# Patient Record
Sex: Male | Born: 1940 | Race: Black or African American | Hispanic: No | Marital: Married | Smoking: Never smoker
Health system: Southern US, Community
[De-identification: ages and names within clinical notes are randomized; demographics above are authoritative.]

## PROBLEM LIST (undated history)

## (undated) DIAGNOSIS — I639 Cerebral infarction, unspecified: Secondary | ICD-10-CM

## (undated) DIAGNOSIS — I10 Essential (primary) hypertension: Secondary | ICD-10-CM

---

## 2015-12-09 ENCOUNTER — Encounter: Payer: Self-pay | Admitting: Emergency Medicine

## 2015-12-09 ENCOUNTER — Encounter: Payer: Medicaid Other | Attending: Surgery | Admitting: Surgery

## 2015-12-09 ENCOUNTER — Inpatient Hospital Stay
Admission: EM | Admit: 2015-12-09 | Discharge: 2015-12-19 | DRG: 988 | Disposition: A | Discharge status: Home Health | Payer: Medicaid Other | Attending: Internal Medicine | Admitting: Internal Medicine

## 2015-12-09 ENCOUNTER — Emergency Department: Payer: Medicaid Other

## 2015-12-09 ENCOUNTER — Other Ambulatory Visit: Payer: Self-pay

## 2015-12-09 DIAGNOSIS — F0390 Unspecified dementia without behavioral disturbance: Secondary | ICD-10-CM | POA: Diagnosis not present

## 2015-12-09 DIAGNOSIS — Z7902 Long term (current) use of antithrombotics/antiplatelets: Secondary | ICD-10-CM

## 2015-12-09 DIAGNOSIS — L089 Local infection of the skin and subcutaneous tissue, unspecified: Secondary | ICD-10-CM

## 2015-12-09 DIAGNOSIS — Z992 Dependence on renal dialysis: Secondary | ICD-10-CM | POA: Insufficient documentation

## 2015-12-09 DIAGNOSIS — D62 Acute posthemorrhagic anemia: Secondary | ICD-10-CM | POA: Diagnosis not present

## 2015-12-09 DIAGNOSIS — L89620 Pressure ulcer of left heel, unstageable: Secondary | ICD-10-CM | POA: Diagnosis present

## 2015-12-09 DIAGNOSIS — I739 Peripheral vascular disease, unspecified: Secondary | ICD-10-CM | POA: Diagnosis present

## 2015-12-09 DIAGNOSIS — L89610 Pressure ulcer of right heel, unstageable: Secondary | ICD-10-CM | POA: Diagnosis present

## 2015-12-09 DIAGNOSIS — N390 Urinary tract infection, site not specified: Secondary | ICD-10-CM | POA: Diagnosis present

## 2015-12-09 DIAGNOSIS — I69320 Aphasia following cerebral infarction: Secondary | ICD-10-CM | POA: Diagnosis not present

## 2015-12-09 DIAGNOSIS — I69354 Hemiplegia and hemiparesis following cerebral infarction affecting left non-dominant side: Secondary | ICD-10-CM | POA: Diagnosis not present

## 2015-12-09 DIAGNOSIS — I69398 Other sequelae of cerebral infarction: Secondary | ICD-10-CM

## 2015-12-09 DIAGNOSIS — M1990 Unspecified osteoarthritis, unspecified site: Secondary | ICD-10-CM | POA: Insufficient documentation

## 2015-12-09 DIAGNOSIS — T148XXA Other injury of unspecified body region, initial encounter: Secondary | ICD-10-CM

## 2015-12-09 DIAGNOSIS — R000 Tachycardia, unspecified: Secondary | ICD-10-CM | POA: Diagnosis not present

## 2015-12-09 DIAGNOSIS — L89614 Pressure ulcer of right heel, stage 4: Secondary | ICD-10-CM | POA: Diagnosis present

## 2015-12-09 DIAGNOSIS — Z79899 Other long term (current) drug therapy: Secondary | ICD-10-CM

## 2015-12-09 DIAGNOSIS — L97409 Non-pressure chronic ulcer of unspecified heel and midfoot with unspecified severity: Secondary | ICD-10-CM | POA: Diagnosis present

## 2015-12-09 DIAGNOSIS — D649 Anemia, unspecified: Secondary | ICD-10-CM | POA: Diagnosis present

## 2015-12-09 DIAGNOSIS — B964 Proteus (mirabilis) (morganii) as the cause of diseases classified elsewhere: Secondary | ICD-10-CM | POA: Diagnosis present

## 2015-12-09 DIAGNOSIS — F0150 Vascular dementia without behavioral disturbance: Secondary | ICD-10-CM | POA: Diagnosis present

## 2015-12-09 DIAGNOSIS — N400 Enlarged prostate without lower urinary tract symptoms: Secondary | ICD-10-CM | POA: Insufficient documentation

## 2015-12-09 DIAGNOSIS — M868X7 Other osteomyelitis, ankle and foot: Secondary | ICD-10-CM | POA: Diagnosis present

## 2015-12-09 DIAGNOSIS — I10 Essential (primary) hypertension: Secondary | ICD-10-CM | POA: Diagnosis present

## 2015-12-09 DIAGNOSIS — G463 Brain stem stroke syndrome: Secondary | ICD-10-CM | POA: Diagnosis not present

## 2015-12-09 DIAGNOSIS — E876 Hypokalemia: Secondary | ICD-10-CM | POA: Diagnosis present

## 2015-12-09 DIAGNOSIS — L8990 Pressure ulcer of unspecified site, unspecified stage: Secondary | ICD-10-CM | POA: Insufficient documentation

## 2015-12-09 DIAGNOSIS — L89624 Pressure ulcer of left heel, stage 4: Secondary | ICD-10-CM | POA: Insufficient documentation

## 2015-12-09 DIAGNOSIS — M869 Osteomyelitis, unspecified: Secondary | ICD-10-CM

## 2015-12-09 DIAGNOSIS — Z7401 Bed confinement status: Secondary | ICD-10-CM | POA: Diagnosis not present

## 2015-12-09 HISTORY — DX: Cerebral infarction, unspecified: I63.9

## 2015-12-09 HISTORY — DX: Essential (primary) hypertension: I10

## 2015-12-09 LAB — CBC
HCT: 42.9 % (ref 40.0–52.0)
Hemoglobin: 13.9 g/dL (ref 13.0–18.0)
MCH: 28.7 pg (ref 26.0–34.0)
MCHC: 32.5 g/dL (ref 32.0–36.0)
MCV: 88.4 fL (ref 80.0–100.0)
PLATELETS: 545 10*3/uL — AB (ref 150–440)
RBC: 4.85 MIL/uL (ref 4.40–5.90)
RDW: 13.6 % (ref 11.5–14.5)
WBC: 14 10*3/uL — ABNORMAL HIGH (ref 3.8–10.6)

## 2015-12-09 LAB — URINALYSIS COMPLETE WITH MICROSCOPIC (ARMC ONLY)
BILIRUBIN URINE: NEGATIVE
Bacteria, UA: NONE SEEN
GLUCOSE, UA: NEGATIVE mg/dL
HGB URINE DIPSTICK: NEGATIVE
Ketones, ur: NEGATIVE mg/dL
Nitrite: NEGATIVE
PH: 5 (ref 5.0–8.0)
Protein, ur: NEGATIVE mg/dL
SPECIFIC GRAVITY, URINE: 1.018 (ref 1.005–1.030)
SQUAMOUS EPITHELIAL / LPF: NONE SEEN

## 2015-12-09 LAB — BASIC METABOLIC PANEL
Anion gap: 12 (ref 5–15)
BUN: 21 mg/dL — AB (ref 6–20)
CALCIUM: 9.9 mg/dL (ref 8.9–10.3)
CO2: 22 mmol/L (ref 22–32)
CREATININE: 1.06 mg/dL (ref 0.61–1.24)
Chloride: 100 mmol/L — ABNORMAL LOW (ref 101–111)
GFR calc Af Amer: >60 mL/min (ref >60)
GFR calc non Af Amer: >60 mL/min (ref >60)
GLUCOSE: 125 mg/dL — AB (ref 65–99)
Potassium: 3.9 mmol/L (ref 3.5–5.1)
Sodium: 134 mmol/L — ABNORMAL LOW (ref 135–145)

## 2015-12-09 MED ORDER — HYDROCODONE-ACETAMINOPHEN 5-325 MG PO TABS
1.0000 | ORAL_TABLET | ORAL | Status: DC | PRN
  Administered 2015-12-13: 1 via ORAL
  Administered 2015-12-16 – 2015-12-18 (×3): 2 via ORAL
  Filled 2015-12-09: qty 1
  Filled 2015-12-09 (×3): qty 2

## 2015-12-09 MED ORDER — NIFEDIPINE 10 MG PO CAPS
20.0000 mg | ORAL_CAPSULE | Freq: Every day | ORAL | Status: DC
  Administered 2015-12-10 – 2015-12-11 (×2): 20 mg via ORAL
  Filled 2015-12-09 (×3): qty 2

## 2015-12-09 MED ORDER — SENNOSIDES-DOCUSATE SODIUM 8.6-50 MG PO TABS
1.0000 | ORAL_TABLET | Freq: Every evening | ORAL | Status: DC | PRN
  Filled 2015-12-09: qty 1

## 2015-12-09 MED ORDER — ONDANSETRON HCL 4 MG PO TABS: 4.0000 mg | ORAL_TABLET | Freq: Four times a day (QID) | ORAL | Status: DC | PRN

## 2015-12-09 MED ORDER — ONDANSETRON HCL 4 MG/2ML IJ SOLN: 4.0000 mg | Freq: Four times a day (QID) | INTRAMUSCULAR | Status: DC | PRN

## 2015-12-09 MED ORDER — VANCOMYCIN HCL IN DEXTROSE 750-5 MG/150ML-% IV SOLN
750.0000 mg | INTRAVENOUS | Status: DC
  Administered 2015-12-10: 750 mg via INTRAVENOUS
  Filled 2015-12-09 (×2): qty 150

## 2015-12-09 MED ORDER — CLOPIDOGREL BISULFATE 75 MG PO TABS
75.0000 mg | ORAL_TABLET | Freq: Every day | ORAL | Status: DC
  Administered 2015-12-10 – 2015-12-16 (×4): 75 mg via ORAL
  Filled 2015-12-09 (×5): qty 1

## 2015-12-09 MED ORDER — ACETAMINOPHEN 325 MG PO TABS
650.0000 mg | ORAL_TABLET | Freq: Four times a day (QID) | ORAL | Status: DC | PRN
  Administered 2015-12-14 (×2): 650 mg via ORAL
  Filled 2015-12-09 (×2): qty 2

## 2015-12-09 MED ORDER — ENOXAPARIN SODIUM 40 MG/0.4ML ~~LOC~~ SOLN
40.0000 mg | SUBCUTANEOUS | Status: DC
  Administered 2015-12-09 – 2015-12-15 (×6): 40 mg via SUBCUTANEOUS
  Filled 2015-12-09 (×7): qty 0.4

## 2015-12-09 MED ORDER — PIPERACILLIN-TAZOBACTAM 3.375 G IVPB 30 MIN
3.3750 g | Freq: Once | INTRAVENOUS | Status: AC
  Administered 2015-12-09: 3.375 g via INTRAVENOUS
  Filled 2015-12-09: qty 50

## 2015-12-09 MED ORDER — PIPERACILLIN-TAZOBACTAM 3.375 G IVPB
3.3750 g | Freq: Three times a day (TID) | INTRAVENOUS | Status: DC
  Administered 2015-12-10 (×2): 3.375 g via INTRAVENOUS
  Filled 2015-12-09 (×3): qty 50

## 2015-12-09 MED ORDER — VANCOMYCIN HCL IN DEXTROSE 750-5 MG/150ML-% IV SOLN: 750.0000 mg | INTRAVENOUS | Status: DC

## 2015-12-09 MED ORDER — ACETAMINOPHEN 650 MG RE SUPP: 650.0000 mg | Freq: Four times a day (QID) | RECTAL | Status: DC | PRN

## 2015-12-09 MED ORDER — ALUM & MAG HYDROXIDE-SIMETH 200-200-20 MG/5ML PO SUSP: 30.0000 mL | Freq: Four times a day (QID) | ORAL | Status: DC | PRN

## 2015-12-09 MED ORDER — PIPERACILLIN-TAZOBACTAM 3.375 G IVPB: 3.3750 g | Freq: Three times a day (TID) | INTRAVENOUS | Status: DC

## 2015-12-09 MED ORDER — VANCOMYCIN HCL IN DEXTROSE 1-5 GM/200ML-% IV SOLN
1000.0000 mg | Freq: Once | INTRAVENOUS | Status: AC
  Administered 2015-12-09: 1000 mg via INTRAVENOUS
  Filled 2015-12-09: qty 200

## 2015-12-09 MED ORDER — SODIUM CHLORIDE 0.9 % IV SOLN
INTRAVENOUS | Status: DC
  Administered 2015-12-09 – 2015-12-17 (×10): via INTRAVENOUS

## 2015-12-09 NOTE — ED Notes (Addendum)
Pt to ed with c/o bilat feet/heel ulcers.  Pt was seen at wound care clinic today and sent to er for eval. Foul odor noted from feet. Pt is nonverbal since CVA.  Family with pt at this time,  Pt appears weak, unable to sit up in wheelchair.

## 2015-12-09 NOTE — Progress Notes (Signed)
ANTIBIOTIC CONSULT NOTE - INITIAL  Pharmacy Consult for piperacillin/tazobactam and vancomycin Indication: Wound infection  No Known Allergies  Patient Measurements: Height: 5' (152.4 cm) Weight: 160 lb (72.576 kg) IBW/kg (Calculated) : 50 Adjusted Body Weight: 59 kg  Vital Signs: Temp: 97.5 F (36.4 C) (01/31 1027) Temp Source: Oral (01/31 1027) BP: 142/76 mmHg (01/31 1027) Pulse Rate: 108 (01/31 1027) Intake/Output from previous day:   Intake/Output from this shift:    Labs:  Recent Labs  12/09/15 1146  WBC 14.0*  HGB 13.9  PLT 545*  CREATININE 1.06   Estimated Creatinine Clearance: 51 mL/min (by C-G formula based on Cr of 1.06). No results for input(s): VANCOTROUGH, VANCOPEAK, VANCORANDOM, GENTTROUGH, GENTPEAK, GENTRANDOM, TOBRATROUGH, TOBRAPEAK, TOBRARND, AMIKACINPEAK, AMIKACINTROU, AMIKACIN in the last 72 hours.   Microbiology: No results found for this or any previous visit (from the past 720 hour(s)).  Medical History: Past Medical History  Diagnosis Date  . CVA (cerebral infarction)   . Hypertension    Assessment: Pharmacy consulted to dose vancomycin and piperacillin/tazobactam for a wound infection in this 75 year old male.   Adjusted body weight: 59 kg CrCl = 51 mL/min  Kinetics: Ke: 0.047 Half-life: 14.7 hours Vd: 41 L  Cmin (estimated) 15.2 mcg/mL  Goal of Therapy:  Vancomycin trough level 15-20 mcg/ml  Plan:  Measure antibiotic drug levels at steady state Follow up culture results  Piperacillin/tazobactam 3.375 g IV q 8 hours EI Vancomycin 1000 mg x 1 dose in the ED followed by vancomycin 750 mg IV q 18 hours. Vancomycin trough at 2/3 @ 0830, which is prior to the 5th dose and should represent steady state.    Cindi Carbon, PharmD Clinical Pharmacist 12/09/2015,7:00 PM

## 2015-12-09 NOTE — H&P (Signed)
[facility] [place] at [facility]   PATIENT NAME: Jeremiah Scott    MR#:  [id number]  DATE OF BIRTH:  January 08, 1941  DATE OF ADMISSION:  12/09/2015  PRIMARY CARE PHYSICIAN: No PCP Per Patient   REQUESTING/REFERRING PHYSICIAN: Dr Langston Masker  CHIEF COMPLAINT:   Heel ulcers HISTORY OF PRESENT ILLNESS:  Jeremiah Scott  is a 76 y.o. male with a known history of  Dementia, essential hypertension in a patient secondary to previous CVA who presents from wound care with bilateral heel ulcers.He has foul odor from his feet.  At baseline patient is a phasic.  PAST MEDICAL HISTORY:   Past Medical History  Diagnosis Date  . CVA (cerebral infarction)   . Hypertension     PAST SURGICAL HISTORY:    postate surgery SOCIAL HISTORY:   Social History  Substance Use Topics  . Smoking status: Never Smoker   . Smokeless tobacco: no  . Alcohol Use: No    FAMILY HISTORY:   unobtainble this patient is a phasic and there is no family at bedside  DRUG ALLERGIES:  No Known Allergies   REVIEW OF SYSTEMS:   patient is aphasic from previous stroke  MEDICATIONS AT HOME:   nifeipine 20 daily  Plavix 75 mg daily  VITAL SIGNS:  Blood pressure 142/76, pulse 108, temperature 97.5 F (36.4 C), temperature source Oral, resp. rate 20, height 5' (1.524 m), weight 72.576 kg (160 lb), SpO2 97 %.  PHYSICAL EXAMINATION:  GENERAL:  75 y.o.-year-old patient lying in the bed with no acute distress.  EYES: Pupils equal, round, reactive to light and accommodation. No scleral icterus.   HEENT: Head atraumatic, normocephalic. Oropharynx and nasopharynx clear.  NECK:  Supple, no jugular venous distention. No thyroid enlargement, no tenderness.  LUNGS: Normal breath sounds bilaterally, no wheezing, rales,rhonchi or crepitation. No use of accessory muscles of respiration.  CARDIOVASCULAR: S1, S2 normal. No murmurs, rubs, or gallops.  ABDOMEN: Soft, nontender, nondistended. Bowel sounds  present. No organomegaly or mass.  EXTREMITIES: No pedal edema, cyanosis, or clubbing.  NEUROLOGIC: aphasic moves extremtites PSYCHIATRIC: The patient is alert  SKIN: bilateral eschar heels with foul smell  LABORATORY PANEL:   CBC  Recent Labs Lab 12/09/15 1146  WBC 14.0*  HGB 13.9  HCT 42.9  PLT 545*   ------------------------------------------------------------------------------------------------------------------  Chemistries   Recent Labs Lab 12/09/15 1146  NA 134*  K 3.9  CL 100*  CO2 22  GLUCOSE 125*  BUN 21*  CREATININE 1.06  CALCIUM 9.9   ------------------------------------------------------------------------------------------------------------------  Cardiac Enzymes No results for input(s): TROPONINI in the last 168 hours. ------------------------------------------------------------------------------------------------------------------  RADIOLOGY:  No results found.  EKG:   Sinus tachycardia no ST elevation or depression  IMPRESSION AND PLAN:     75 year old male with history of stoke with residual aphasia who presents with bilateral heel ulcers from wound care clinic.  1. Bilateral  Heel ulcers: consult wound care team and PODIATRY. Wound culture. Start Zosyn and vancomycin.  I have ordered MRI to evaluate for  Osteomyelitis.  2.  Essential hypertension:continue nifedipine  3. Hx of CVA: Speech consult for diet. continue Plavix   All the records are reviewed and case discussed with ED provider. CODE STATUS: FULL  TOTAL TIME TAKING CARE OF THIS PATIENT: 50 minutes.    Roseana Rhine M.D on 12/09/2015 at 6:28 PM  Between 7am to 6pm - Pager - [phone] After 6pm go to [email or web address] - Scientist, research (life sciences) Hospitalists  Office  [phone]  CC: Primary care physician; No PCP Per Patient

## 2015-12-09 NOTE — ED Provider Notes (Signed)
[facility] Emergency Department Provider Note  ____________________________________________  Time seen: Approximately 545 PM  I have reviewed the triage vital signs and the nursing notes.   HISTORY  Chief Complaint Wound Infection    HPI Jeremiah Scott is a 75 y.o. male with a history of a CVA and hypertension who is presenting today from the wound clinic for bilateral heel ulcers. The patient is nonverbal with left-sided weakness which is residual from a stroke at baseline. He is here with his family member that says that he started out having difficulty with the skin on his feet this past November when he had calluses and then blistering. He has been using mupirocin but the wounds have been worsening. He was first be seen at [facility] wound care but they were unable to see him soon so he was brought to [facility] wound care. Earlier today he was evaluated there and then told to immediately come to the emergency department. No fevers at home.   Past Medical History  Diagnosis Date  . CVA (cerebral infarction)   . Hypertension     There are no active problems to display for this patient.   History reviewed. No pertinent past surgical history.  No current outpatient prescriptions on file.  Allergies Review of patient's allergies indicates no known allergies.  History reviewed. No pertinent family history.  Social History Social History  Substance Use Topics  . Smoking status: Never Smoker   . Smokeless tobacco: None  . Alcohol Use: No    Review of Systems  Caveat secondary to patient nonverbal. ____________________________________________   PHYSICAL EXAM:  VITAL SIGNS: ED Triage Vitals  Enc Vitals Group     BP 12/09/15 1027 142/76 mmHg     Pulse Rate 12/09/15 1027 108     Resp 12/09/15 1027 20     Temp 12/09/15 1027 97.5 F (36.4 C)     Temp Source 12/09/15 1027 Oral     SpO2 12/09/15 1027 97 %     Weight 12/09/15 1027 160 lb (72.576 kg)     Height 12/09/15 1027 5' (1.524 m)     Head Cir --      Peak Flow --      Pain Score --      Pain Loc --      Pain Edu? --      Excl. in GC? --     Constitutional: in no acute distress. Eyes: Conjunctivae are normal. PERRL. EOMI. Head: Atraumatic. Nose: No congestion/rhinnorhea. Mouth/Throat: Mucous membranes are moist.  Neck: No stridor.   Cardiovascular: Tachycardic, regular rhythm. Grossly normal heart sounds.  Faintly palpable bilateral dorsalis pedis pulses. Respiratory: Normal respiratory effort.  No retractions. Lungs CTAB. Gastrointestinal: Soft and nontender. No distention. Musculoskeletal: Left foot is edematous. Neurologic:  Nonverbal. Moving all 4 extremities. Skin:  Bilateral 4 x 6 cm heel ulcerations with necrotic eschar covering them and yellow pus surrounded by erythematous skin at the borders. The left foot is edematous with erythema streaking down the left lateral aspect. Both feet are tender to palpation with warmth. Ranges the toes to both feet.   Psychiatric: Mood and affect are normal. Speech and behavior are normal.  ____________________________________________   LABS (all labs ordered are listed, but only abnormal results are displayed)  Labs Reviewed  BASIC METABOLIC PANEL - Abnormal; Notable for the following:    Sodium 134 (*)    Chloride 100 (*)    Glucose, Bld 125 (*)    BUN 21 (*)  All other components within normal limits  CBC - Abnormal; Notable for the following:    WBC 14.0 (*)    Platelets 545 (*)    All other components within normal limits  CULTURE, BLOOD (ROUTINE X 2)  CULTURE, BLOOD (ROUTINE X 2)  URINALYSIS COMPLETEWITH MICROSCOPIC ([facility] ONLY)   ____________________________________________  EKG  ED ECG REPORT I, Arelia Longest, the attending physician, personally viewed and interpreted this ECG.   Date: 12/09/2015  EKG Time: 1139  Rate: 107  Rhythm: sinus tachycardia  Axis: Normal  Intervals:none  ST&T Change: No  ST segment elevation or depression. No abnormal T-wave inversions.  ____________________________________________  RADIOLOGY  Pending bilateral foot images. ____________________________________________   PROCEDURES  ____________________________________________   INITIAL IMPRESSION / ASSESSMENT AND PLAN / ED COURSE  Pertinent labs & imaging results that were available during my care of the patient were reviewed by me and considered in my medical decision making (see chart for details).  ----------------------------------------- 6:19 PM on 12/09/2015 -----------------------------------------  Discussed case with the family member who knows the patient needs to be admitted for IV antibiotics and surgical consultation. She understands the plan and is willing to comply. Signed out to Dr. Tildon Husky. Dr. Tildon Husky to follow-up with imaging. ____________________________________________   FINAL CLINICAL IMPRESSION(S) / ED DIAGNOSES  Bilateral necrotic heel ulcers.    Myrna Blazer, MD 12/09/15 Zollie Pee

## 2015-12-10 ENCOUNTER — Inpatient Hospital Stay: Payer: Medicaid Other

## 2015-12-10 DIAGNOSIS — L8990 Pressure ulcer of unspecified site, unspecified stage: Secondary | ICD-10-CM | POA: Insufficient documentation

## 2015-12-10 LAB — CBC
HEMATOCRIT: 35.6 % — AB (ref 40.0–52.0)
HEMOGLOBIN: 11.7 g/dL — AB (ref 13.0–18.0)
MCH: 28.9 pg (ref 26.0–34.0)
MCHC: 33 g/dL (ref 32.0–36.0)
MCV: 87.6 fL (ref 80.0–100.0)
Platelets: 515 10*3/uL — ABNORMAL HIGH (ref 150–440)
RBC: 4.07 MIL/uL — ABNORMAL LOW (ref 4.40–5.90)
RDW: 13.3 % (ref 11.5–14.5)
WBC: 11.3 10*3/uL — AB (ref 3.8–10.6)

## 2015-12-10 LAB — BASIC METABOLIC PANEL
ANION GAP: 10 (ref 5–15)
BUN: 20 mg/dL (ref 6–20)
CHLORIDE: 102 mmol/L (ref 101–111)
CO2: 25 mmol/L (ref 22–32)
Calcium: 9.6 mg/dL (ref 8.9–10.3)
Creatinine, Ser: 1.37 mg/dL — ABNORMAL HIGH (ref 0.61–1.24)
GFR calc Af Amer: 57 mL/min — ABNORMAL LOW (ref >60)
GFR, EST NON AFRICAN AMERICAN: 49 mL/min — AB (ref >60)
GLUCOSE: 165 mg/dL — AB (ref 65–99)
POTASSIUM: 3.6 mmol/L (ref 3.5–5.1)
Sodium: 137 mmol/L (ref 135–145)

## 2015-12-10 LAB — VANCOMYCIN, RANDOM: Vancomycin Rm: 17 ug/mL

## 2015-12-10 MED ORDER — LORAZEPAM 2 MG/ML IJ SOLN
1.0000 mg | Freq: Once | INTRAMUSCULAR | Status: AC
  Administered 2015-12-10: 1 mg via INTRAVENOUS
  Filled 2015-12-10: qty 1

## 2015-12-10 MED ORDER — VANCOMYCIN HCL IN DEXTROSE 750-5 MG/150ML-% IV SOLN
750.0000 mg | INTRAVENOUS | Status: DC
  Administered 2015-12-10 – 2015-12-13 (×5): 750 mg via INTRAVENOUS
  Filled 2015-12-10 (×6): qty 150

## 2015-12-10 MED ORDER — PIPERACILLIN-TAZOBACTAM 3.375 G IVPB
3.3750 g | Freq: Two times a day (BID) | INTRAVENOUS | Status: DC
  Administered 2015-12-11: 3.375 g via INTRAVENOUS
  Filled 2015-12-10 (×3): qty 50

## 2015-12-10 NOTE — Progress Notes (Signed)
MELVILLE, ENGEN ([id number]) Visit Report for 12/09/2015 Abuse/Suicide Risk Screen Details Patient Name: Jeremiah Scott, Jeremiah Scott Date of Service: 12/09/2015 8:00 AM Medical Record Number: [id number] Patient Account Number: [id number] Date of Birth/Sex: 1941/08/14 (75 y.o. Male) Treating RN: Curtis Sites Primary Care Physician: Other Clinician: Referring Physician: Treating Physician/Extender: Rudene Re in Treatment: 0 Abuse/Suicide Risk Screen Items Answer ABUSE/SUICIDE RISK SCREEN: Has anyone close to you tried to hurt or harm you recentlyo No Do you feel uncomfortable with anyone in your familyo No Has anyone forced you do things that you didnot want to doo No Do you have any thoughts of harming yourselfo No Patient displays signs or symptoms of abuse and/or neglect. No Electronic Signature(s) Signed: 12/09/2015 4:04:05 PM By: Curtis Sites Entered By: Curtis Sites on 12/09/2015 08:40:32 Jeremiah Scott ([id number]) -------------------------------------------------------------------------------- Activities of Daily Living Details Patient Name: Jeremiah Scott Date of Service: 12/09/2015 8:00 AM Medical Record Number: [id number] Patient Account Number: [id number] Date of Birth/Sex: 09-Nov-1940 (75 y.o. Male) Treating RN: Curtis Sites Primary Care Physician: Other Clinician: Referring Physician: Treating Physician/Extender: Rudene Re in Treatment: 0 Activities of Daily Living Items Answer Activities of Daily Living (Please select one for each item) Drive Automobile Not Able Take Medications Need Assistance Use Telephone Need Assistance Care for Appearance Need Assistance Use Toilet Need Assistance Bath / Shower Need Assistance Dress Self Need Assistance Feed Self Need Assistance Walk Need Assistance Get In / Out Bed Not Able Housework Not Able Prepare Meals Not Able Handle Money Not Able Shop for Self Not Able Electronic Signature(s) Signed: 12/09/2015 4:04:05 PM  By: Curtis Sites Entered By: Curtis Sites on 12/09/2015 08:42:44 Jeremiah Scott ([id number]) -------------------------------------------------------------------------------- Education Assessment Details Patient Name: Jeremiah Scott Date of Service: 12/09/2015 8:00 AM Medical Record Number: [id number] Patient Account Number: [id number] Date of Birth/Sex: June 05, 1941 (75 y.o. Male) Treating RN: Curtis Sites Primary Care Physician: Other Clinician: Referring Physician: Treating Physician/Extender: Rudene Re in Treatment: 0 Primary Learner Assessed: Caregiver Reason Patient is not Primary Learner: dementia Learning Preferences/Education Level/Primary Language Learning Preference: Explanation, Demonstration Highest Education Level: High School Preferred Language: English Cognitive Barrier Assessment/Beliefs Language Barrier: No Translator Needed: No Memory Deficit: No Emotional Barrier: No Cultural/Religious Beliefs Affecting Medical No Care: Physical Barrier Assessment Impaired Vision: No Impaired Hearing: No Decreased Hand dexterity: No Knowledge/Comprehension Assessment Knowledge Level: Medium Comprehension Level: Medium Ability to understand written Medium instructions: Ability to understand verbal Medium instructions: Motivation Assessment Anxiety Level: Calm Cooperation: Cooperative Education Importance: Acknowledges Need Interest in Health Problems: Asks Questions Perception: Coherent Willingness to Engage in Self- Medium Management Activities: Readiness to Engage in Self- Medium Management Activities: DESTYN, SCHUYLER ([id number]) Electronic Signature(s) Signed: 12/09/2015 4:04:05 PM By: Curtis Sites Entered By: Curtis Sites on 12/09/2015 08:43:28 Jeremiah Scott ([id number]) -------------------------------------------------------------------------------- Fall Risk Assessment Details Patient Name: Jeremiah Scott Date of Service: 12/09/2015 8:00  AM Medical Record Number: [id number] Patient Account Number: [id number] Date of Birth/Sex: 09-26-1941 (75 y.o. Male) Treating RN: Curtis Sites Primary Care Physician: Other Clinician: Referring Physician: Treating Physician/Extender: Rudene Re in Treatment: 0 Fall Risk Assessment Items Have you had 2 or more falls in the last 12 monthso 0 No Have you had any fall that resulted in injury in the last 12 monthso 0 No FALL RISK ASSESSMENT: History of falling - immediate or within 3 months 0 No Secondary diagnosis 0 No Ambulatory aid None/bed rest/wheelchair/nurse 0 Yes Crutches/cane/walker 0 No Furniture 0 No IV Access/Saline Lock 0 No Gait/Training Normal/bed rest/immobile 0 Yes Weak 10 Yes Impaired 0 No Mental  Status Oriented to own ability 0 Yes Electronic Signature(s) Signed: 12/09/2015 4:04:05 PM By: Curtis Sites Entered By: Curtis Sites on 12/09/2015 08:43:46 Jeremiah Scott ([id number]) -------------------------------------------------------------------------------- Foot Assessment Details Patient Name: Jeremiah Scott Date of Service: 12/09/2015 8:00 AM Medical Record Number: [id number] Patient Account Number: [id number] Date of Birth/Sex: 19-Sep-1941 (75 y.o. Male) Treating RN: Curtis Sites Primary Care Physician: Other Clinician: Referring Physician: Treating Physician/Extender: Rudene Re in Treatment: 0 Foot Assessment Items Site Locations + = Sensation present, - = Sensation absent, C = Callus, U = Ulcer R = Redness, W = Warmth, M = Maceration, PU = Pre-ulcerative lesion F = Fissure, S = Swelling, D = Dryness Assessment Right: Left: Other Deformity: No No Prior Foot Ulcer: No No Prior Amputation: No No Charcot Joint: No No Ambulatory Status: Non-ambulatory Assistance Device: Wheelchair GaitFutures trader) Signed: 12/09/2015 4:04:05 PM By: Curtis Sites Entered By: Curtis Sites on 12/09/2015 08:47:07 Jeremiah Scott  ([id number]) -------------------------------------------------------------------------------- Nutrition Risk Assessment Details Patient Name: Jeremiah Scott Date of Service: 12/09/2015 8:00 AM Medical Record Number: [id number] Patient Account Number: [id number] Date of Birth/Sex: 06-10-1941 (74 y.o. Male) Treating RN: Curtis Sites Primary Care Physician: Other Clinician: Referring Physician: Treating Physician/Extender: Rudene Re in Treatment: 0 Height (in): Weight (lbs): Body Mass Index (BMI): Nutrition Risk Assessment Items NUTRITION RISK SCREEN: I have an illness or condition that made me change the kind and/or 0 No amount of food I eat I eat fewer than two meals per day 3 Yes I eat few fruits and vegetables, or milk products 0 No I have three or more drinks of beer, liquor or wine almost every day 0 No I have tooth or mouth problems that make it hard for me to eat 0 No I don't always have enough money to buy the food I need 0 No I eat alone most of the time 0 No I take three or more different prescribed or over-the-counter drugs a 1 Yes day Without wanting to, I have lost or gained 10 pounds in the last six 0 No months I am not always physically able to shop, cook and/or feed myself 0 No Nutrition Protocols Good Risk Protocol Provide education on Moderate Risk Protocol 0 nutrition Electronic Signature(s) Signed: 12/09/2015 4:04:05 PM By: Curtis Sites Entered By: Curtis Sites on 12/09/2015 08:44:05

## 2015-12-10 NOTE — Consult Note (Signed)
[facility] Vascular Consult Note  MRN : [id number]  Jeremiah Scott is a 75 y.o. (1941/03/06) male who presents with chief complaint of  Chief Complaint  Patient presents with  . Wound Infection  .  History of Present Illness: I am asked by the hospitalist service to see the patient to assess his vascular status. Patient with bilateral heel ulcerations is admitted to the hospital. 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 He has bilateral heel ulcerations which his wife reports of been there for a couple of months. Apparently, 3 months ago he was walking which is a little difficult for me to believe. This has been a rapid deterioration and now he has large heel ulcerations bilaterally. He has a history of hypertension and stroke. His wife does not think he has had any vascular evaluations of his lower extremity. 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 He has had low-grade fevers and is admitted to the hospital for infections of his heel ulcerations.  Current Facility-Administered Medications  Medication Dose Route Frequency Provider Last Rate Last Dose  . 0.9 %  sodium chloride infusion   Intravenous Continuous Adrian Saran, MD 100 mL/hr at 12/09/15 2227    . acetaminophen (TYLENOL) tablet 650 mg  650 mg Oral Q6H PRN Adrian Saran, MD       Or  . acetaminophen (TYLENOL) suppository 650 mg  650 mg Rectal Q6H PRN Adrian Saran, MD      . alum & mag hydroxide-simeth (MAALOX/MYLANTA) 200-200-20 MG/5ML suspension 30 mL  30 mL Oral Q6H PRN Sital Mody, MD      . clopidogrel (PLAVIX) tablet 75 mg  75 mg Oral Daily Sital Mody, MD   75 mg at 12/10/15 1146  . enoxaparin (LOVENOX) injection 40 mg  40 mg Subcutaneous Q24H Adrian Saran, MD   40 mg at 12/09/15 2233  . HYDROcodone-acetaminophen (NORCO/VICODIN) 5-325 MG per tablet 1-2 tablet  1-2 tablet Oral Q4H PRN Adrian Saran, MD      . NIFEdipine  (PROCARDIA) capsule 20 mg  20 mg Oral Daily Sital Mody, MD   20 mg at 12/10/15 1146  . ondansetron (ZOFRAN) tablet 4 mg  4 mg Oral Q6H PRN Adrian Saran, MD       Or  . ondansetron (ZOFRAN) injection 4 mg  4 mg Intravenous Q6H PRN Adrian Saran, MD      . Melene Muller ON 12/11/2015] piperacillin-tazobactam (ZOSYN) IVPB 3.375 g  3.375 g Intravenous Q12H Sital Mody, MD      . senna-docusate (Senokot-S) tablet 1 tablet  1 tablet Oral QHS PRN Adrian Saran, MD        Past Medical History  Diagnosis Date  . CVA (cerebral infarction)   . Hypertension     History reviewed. No pertinent past surgical history.  Social History Social History  Substance Use Topics  . Smoking status: Never Smoker   . Smokeless tobacco: None  . Alcohol Use: No   married, wife accompanies him  Family History Unable to obtain as the patient is a phasic and nothing is listed in the previous medical record  No Known Allergies   REVIEW OF SYSTEMS (Negative unless checked) Very difficult to obtain as the patient is a phasic but the previous medical record and his wife provide some history Constitutional: Weight loss  Fever  Chills Cardiac: Chest pain   Chest pressure   Palpitations     Shortness of breath when laying flat   [] Shortness of breath at rest   [] Shortness of breath with exertion. Vascular:  [] Pain in legs with walking   [] Pain in legs at rest   [] Pain in legs when laying flat   [] Claudication   [] Pain in feet when walking  [] Pain in feet at rest  [] Pain in feet when laying flat   [] History of DVT   [] Phlebitis   [] Swelling in legs   [] Varicose veins   [x] Non-healing ulcers Pulmonary:   [] Uses home oxygen   [] Productive cough   [] Hemoptysis   [] Wheeze  [] COPD   [] Asthma Neurologic:  [] Dizziness  [] Blackouts   [] Seizures   [x] History of stroke   [] History of TIA  [] Aphasia   [] Temporary blindness   [] Dysphagia   [] Weakness or numbness in arms   [] Weakness or numbness in legs Musculoskeletal:  [] Arthritis    [] Joint swelling   [] Joint pain   [] Low back pain Hematologic:  [] Easy bruising  [] Easy bleeding   [] Hypercoagulable state   [] Anemic  [] Hepatitis Gastrointestinal:  [] Blood in stool   [] Vomiting blood  [] Gastroesophageal reflux/heartburn   [] Difficulty swallowing. Genitourinary:  [] Chronic kidney disease   [] Difficult urination  [] Frequent urination  [] Burning with urination   [] Blood in urine Skin:  [] Rashes   [x] Ulcers   [x] Wounds Psychological:  [] History of anxiety   []  History of major depression.  Physical Examination  Filed Vitals:   12/09/15 2222 12/10/15 0300 12/10/15 0756 12/10/15 1455  BP: 143/54 145/57 155/82 123/42  Pulse: 105 107 101 107  Temp: 97.9 F (36.6 C) 98 F (36.7 C) 98.7 F (37.1 C) 99.9 F (37.7 C)  TempSrc: Oral Oral Oral Oral  Resp: 16 17 18 17   Height:      Weight:      SpO2: 100% 100% 99% 100%   Body mass index is 31.25 kg/(m^2). Gen:  Debilitated, frail individual with severe flexion contractures lying in the bed. Head: [place]/AT, temporalis wasting. Prominent temp pulse not noted. Ear/Nose/Throat: Hearing grossly intact, nares w/o erythema or drainage, oropharynx w/o Erythema/Exudate Eyes: PERRLA, EOMI.  Neck: Supple, no nuchal rigidity.  No JVD.  Pulmonary:  Good air movement, equal bilaterally.  Cardiac: Irregular, no murmur Vascular:  Vessel Right Left  Radial Palpable Palpable  Ulnar Palpable Palpable  Brachial Palpable Palpable  Carotid Palpable, without bruit Palpable, without bruit  Aorta Not palpable N/A  Femoral Palpable Palpable  Popliteal  not Palpable  not Palpable  PT  not Palpable  not Palpable  DP  not Palpable  not Palpable   Gastrointestinal: soft, non-tender/non-distended. No guarding/reflex. No masses, surgical incisions, or scars. Musculoskeletal: Patient has severe flexion contractures in both lower extremities which are rigid and unable to move. Patient is nonambulatory.. Mild edema bilaterally Neurologic: Patient is  aphasic. He has severe flexion contractures in his extremities which are rigid and difficult to move. Minimal strength detected. Psychiatric: Difficult to assess given his neurologic situation after his stroke. Patient is aphasic. Dermatologic: Bilateral heel ulcerations are present with thick necrotic eschar with some purulent material noted. Mild surrounding erythema bilaterally. Lymph : No Cervical, Axillary, or Inguinal lymphadenopathy.      CBC Lab Results  Component Value Date   WBC 11.3* 12/10/2015   HGB 11.7* 12/10/2015   HCT 35.6* 12/10/2015   MCV 87.6 12/10/2015   PLT 515* 12/10/2015    BMET    Component Value Date/Time   NA 137 12/10/2015 1214   K 3.6 12/10/2015 1214  CL 102 12/10/2015 1214   CO2 25 12/10/2015 1214   GLUCOSE 165* 12/10/2015 1214   BUN 20 12/10/2015 1214   CREATININE 1.37* 12/10/2015 1214   CALCIUM 9.6 12/10/2015 1214   GFRNONAA 49* 12/10/2015 1214   GFRAA 57* 12/10/2015 1214   Estimated Creatinine Clearance: 39.5 mL/min (by C-G formula based on Cr of 1.37).  COAG No results found for: INR, PROTIME  Radiology Dg Foot 2 Views Left  12/09/2015  CLINICAL DATA:  Bilateral heel ulcers. EXAM: LEFT FOOT - 2 VIEW; RIGHT FOOT - 2 VIEW COMPARISON:  None. FINDINGS: Left foot: Mild to moderate degenerative changes most notably at the first metatarsal phalangeal joint. Extensive vascular calcifications are noted. There is a large open wound involving the heel. No obvious destructive bony changes involving the calcaneus to suggest osteomyelitis. Calcaneal spurring changes are noted. Right foot: Mild to moderate degenerative changes most notably at the first metatarsal phalangeal joint. Extensive vascular calcifications. Soft tissue defect involving the heel but no obvious destructive bony changes to suggest osteomyelitis. IMPRESSION: Bilateral heel ulcers without obvious plain film findings for underlying osteomyelitis. MRI may be helpful for further evaluation.  Electronically Signed   By: Rudie Meyer M.D.   On: 12/09/2015 18:34   Dg Foot 2 Views Right  12/09/2015  CLINICAL DATA:  Bilateral heel ulcers. EXAM: LEFT FOOT - 2 VIEW; RIGHT FOOT - 2 VIEW COMPARISON:  None. FINDINGS: Left foot: Mild to moderate degenerative changes most notably at the first metatarsal phalangeal joint. Extensive vascular calcifications are noted. There is a large open wound involving the heel. No obvious destructive bony changes involving the calcaneus to suggest osteomyelitis. Calcaneal spurring changes are noted. Right foot: Mild to moderate degenerative changes most notably at the first metatarsal phalangeal joint. Extensive vascular calcifications. Soft tissue defect involving the heel but no obvious destructive bony changes to suggest osteomyelitis. IMPRESSION: Bilateral heel ulcers without obvious plain film findings for underlying osteomyelitis. MRI may be helpful for further evaluation. Electronically Signed   By: Rudie Meyer M.D.   On: 12/09/2015 18:34     Assessment/Plan 1. Bilateral heel ulcerations with necrotic eschar and evidence of cellulitis. Nonpalpable pedal pulses suspecting significant peripheral vascular disease. This is a very serious and clearly limb threatening situation and given his severe flexion contractures likely from his previous stroke, getting these to heal is going to be extremely difficult. I discussed this case with Dr. Ether Griffins from podiatry who plans to debride the heel ulcers later this week. He will require general anesthesia to do his angiogram due to his flexion contractures. Even with anesthesia, I am not sure we can get him straight enough to get a thorough evaluation of his legs. I have discussed that the likelihood of wound healing without better perfusion is extremely small. The patient's wife seems very upset by this. 2. Previous stroke with severe flexion contractures. Significantly complicates my ability to perform angiogram as well as  the pressure on the wounds. Very bad situation. 3. HTN. Stable. On outpatient medications   Fartun Paradiso, MD  12/10/2015 4:04 PM

## 2015-12-10 NOTE — Progress Notes (Signed)
ANTIBIOTIC CONSULT NOTE - FOLLOW UP   Pharmacy Consult for piperacillin/tazobactam and vancomycin Indication: Wound infection  No Known Allergies  Patient Measurements: Height: 5' (152.4 cm) Weight: 160 lb (72.576 kg) IBW/kg (Calculated) : 50 Adjusted Body Weight: 59 kg  Vital Signs: Temp: 98.1 F (36.7 C) (02/01 2146) Temp Source: Oral (02/01 2146) BP: 156/72 mmHg (02/01 2146) Pulse Rate: 107 (02/01 2146) Intake/Output from previous day:   Intake/Output from this shift:    Labs:  Recent Labs  12/09/15 1146 12/10/15 1214  WBC 14.0* 11.3*  HGB 13.9 11.7*  PLT 545* 515*  CREATININE 1.06 1.37*   Estimated Creatinine Clearance: 39.5 mL/min (by C-G formula based on Cr of 1.37).  Recent Labs  12/10/15 2051  [facility] 17     Microbiology: Recent Results (from the past 720 hour(s))  Wound culture     Status: None (Preliminary result)   Collection Time: 12/09/15 11:53 PM  Result Value Ref Range Status   Specimen Description FOOT  Final   Special Requests NONE  Final   Gram Stain RARE WBC SEEN FEW GRAM NEGATIVE RODS   Final   Culture NO GROWTH < 12 HOURS  Final   Report Status PENDING  Incomplete    Medical History: Past Medical History  Diagnosis Date  . CVA (cerebral infarction)   . Hypertension    Assessment: Pharmacy consulted to dose vancomycin and piperacillin/tazobactam for a wound infection in this 75 year old male.   Adjusted body weight: 59 kg CrCl = 51 mL/min  Kinetics: Ke: 0.047 Half-life: 14.7 hours Vd: 41 L  Cmin (estimated) 15.2 mcg/mL   2/1: Serum creatinine now 1.38 mg/dl from 1.0  Goal of Therapy:  Vancomycin trough level 15-20 mcg/ml  Plan:  Measure antibiotic drug levels at steady state Follow up culture results   Vancomycin: Due to patient's renal function changing within 24 hours span, will discontinue VAncomycin for now and order a random level to be drawn ~18 hours post dose. If patient's random level is within normal  range, can continue Vancomycin 750 mg IV q18 hours. If not will need to adjust based on random level result.  Zosyn: Since patient is in AKI, can't accurately measure renal function with CrCl. Will dose Zosyn as if Crcl <10 ml/min; therefore will change to Zosyn 3.375 g IV q12 hours.   2/1:  Vanc level @ 21:00 = 17 mcg/mL Will resume Vancomycin 750 mg IV Q18H to start 2/1 @ 23:00. Will draw next trough on 2/3 @ 10:30.    Scherrie Gerlach, PharmD Clinical Pharmacist 12/10/2015,10:21 PM

## 2015-12-10 NOTE — Evaluation (Signed)
Clinical/Bedside Swallow Evaluation Patient Details  Name: Jeremiah Scott MRN: [id number] Date of Birth: 1941-06-08  Today's Date: 12/10/2015 Time: SLP Start Time (ACUTE ONLY): 1015 SLP Stop Time (ACUTE ONLY): 1115 SLP Time Calculation (min) (ACUTE ONLY): 60 min  Past Medical History:  Past Medical History  Diagnosis Date  . CVA (cerebral infarction)   . Hypertension    Past Surgical History: History reviewed. No pertinent past surgical history. HPI:  Pt is a 75 y.o. male with a known history ofDementia, essential hypertension, and previous CVA in 2011 who presents from wound care with bilateral heel ulcers. He has foul odor from his feet. At baseline patient is aphasic and speaks minimally but seems to understand what is said to him by his family in Native language(family status pt does understand "a little [place]"). Interpreter was not present; unsure if pt would be able to follow through w/ a language line. Dtr conversed w/ family to encourage him to take po's. Wife and Dtr stated pt eats "softened" foods at home and deny any consistent, overt s/s of aspiration during meals(stated is usually happened when he drank fast).    Assessment / Plan / Recommendation Clinical Impression  Pt appeared to adequately tolerate trials of thin liquids via straw (pinching straw to control bolus size/amount) following aspiration precautions including feeding single bites/sips slowly and checking for oral clearing. Also reduced distractions in room during trials. No decline in respiratory status was noted b/t trials as trials continued. Min. increased oral phase time w/ boluses of increased texture (puree) was noted but pt cleared appropriately given time w/ trials. Pt required feeding assistance; confusion noted at baseline. Dtr and wife present in room described oral phase issues of oral holding as well as inconsistent coughing w/ po's/drinks (of larger sip size/amount) which could be related to his declined  Cognitive status/Dementia. Pt appears at reduced-mild risk(Dementia) for aspiration following general aspiration precautions as discussed and posted in room.     Aspiration Risk  Mild aspiration risk    Diet Recommendation  Dys. 1 w/ thin liquids - via pinched straw to limit bolus size/amount; aspiration precautions; feeding assistance  Medication Administration: Crushed with puree    Other  Recommendations Recommended Consults:  (Dietician) Oral Care Recommendations: Oral care BID;Staff/trained caregiver to provide oral care   Follow up Recommendations   (TBD palliative care consult)    Frequency and Duration min 2x/week  2 weeks       Prognosis Prognosis for Safe Diet Advancement: Fair Barriers to Reach Goals: Cognitive deficits;Severity of deficits      Swallow Study   General Date of Onset: 12/09/15 HPI: Pt is a 75 y.o. male with a known history ofDementia, essential hypertension, and previous CVA in 2011 who presents from wound care with bilateral heel ulcers. He has foul odor from his feet. At baseline patient is aphasic and speaks minimally but seems to understand what is said to him by his family in Native language(family status pt does understand "a little [place]"). Interpreter was not present; unsure if pt would be able to follow through w/ a language line. Dtr conversed w/ family to encourage him to take po's. Wife and Dtr stated pt eats "softened" foods at home and deny any consistent, overt s/s of aspiration during meals(stated is usually happened when he drank fast).  Type of Study: Bedside Swallow Evaluation Previous Swallow Assessment: none indicated by family Diet Prior to this Study: Thin liquids (softened foods) Temperature Spikes Noted: No (wbc 11.3) Respiratory Status:  Room air History of Recent Intubation: No Behavior/Cognition: Confused;Distractible;Requires cueing;Doesn't follow directions (awake; nonverbal) Oral Cavity Assessment:  (unable to fully assess  sec. to Cognitive status decline) Oral Care Completed by SLP: No (unsure) Oral Cavity - Dentition: Missing dentition Vision:  (n/a) Self-Feeding Abilities: Total assist Patient Positioning: Postural control adequate for testing (legs contracted; on side) Baseline Vocal Quality:  (nonverbal) Volitional Cough: Cognitively unable to elicit Volitional Swallow: Unable to elicit    Oral/Motor/Sensory Function Overall Oral Motor/Sensory Function:  (unable to fully assess sec. to Cognitive decline)   Ice Chips Ice chips: Impaired Presentation: Spoon (fed; 5 trials) Oral Phase Impairments: Poor awareness of bolus Oral Phase Functional Implications:  (none) Pharyngeal Phase Impairments:  (none)   Thin Liquid Thin Liquid: Impaired Presentation: Straw (fed; ~4 ozs total) Oral Phase Impairments: Poor awareness of bolus Oral Phase Functional Implications:  (none) Pharyngeal  Phase Impairments:  (none) Other Comments: pinched straw to control bolus amount/size    Nectar Thick Nectar Thick Liquid: Not tested   Honey Thick Honey Thick Liquid: Not tested   Puree Puree: Impaired Presentation: Spoon (fed; 10 trials) Oral Phase Impairments: Poor awareness of bolus Oral Phase Functional Implications: Oral holding (min.) Pharyngeal Phase Impairments:  (none)   Solid   GO   Solid: Not tested        Jerilynn Som, MS, CCC-SLP  Anees Vanecek 12/10/2015,4:36 PM

## 2015-12-10 NOTE — Consult Note (Signed)
ORTHOPAEDIC CONSULTATION  REQUESTING PHYSICIAN: Adrian Saran, MD  Chief Complaint: Heel ulcerations  HPI: Jeremiah Scott is a 75 y.o. male who complains of  Heel ulcers.  Pt not able to provide ROS and mostly aphasic today. Wife and daughter provide most history.  Pt admitted with b/l heel ulcers.  Family states pt was walking up until November and then became bed bound.  Hx of stroke in 2011 but no recent history of CVA.  Rapid decline over last 2-3 months.  Family states pt stopped walking in late November and is now in flexion contraction for since December.  Was applying a wound ointment to heels and eventually seen at wound care center this week.  Immediately referred to hospital.     Past Medical History  Diagnosis Date  . CVA (cerebral infarction)   . Hypertension    History reviewed. No pertinent past surgical history. Social History   Social History  . Marital Status: Married    Spouse Name: N/A  . Number of Children: N/A  . Years of Education: N/A   Social History Main Topics  . Smoking status: Never Smoker   . Smokeless tobacco: None  . Alcohol Use: No  . Drug Use: No  . Sexual Activity: Not Asked   Other Topics Concern  . None   Social History Narrative  . None   History reviewed. No pertinent family history. No Known Allergies Prior to Admission medications   Medication Sig Start Date End Date Taking? Authorizing Provider  clopidogrel (PLAVIX) 75 MG tablet Take 75 mg by mouth daily.   Yes Historical Provider, MD  mupirocin ointment (BACTROBAN) 2 % Apply 1 application topically 3 (three) times daily.   Yes Historical Provider, MD  NIFEdipine (PROCARDIA) 20 MG capsule Take 20 mg by mouth daily.   Yes Historical Provider, MD   Dg Foot 2 Views Left  12/09/2015  CLINICAL DATA:  Bilateral heel ulcers. EXAM: LEFT FOOT - 2 VIEW; RIGHT FOOT - 2 VIEW COMPARISON:  None. FINDINGS: Left foot: Mild to moderate degenerative changes most notably at the first metatarsal  phalangeal joint. Extensive vascular calcifications are noted. There is a large open wound involving the heel. No obvious destructive bony changes involving the calcaneus to suggest osteomyelitis. Calcaneal spurring changes are noted. Right foot: Mild to moderate degenerative changes most notably at the first metatarsal phalangeal joint. Extensive vascular calcifications. Soft tissue defect involving the heel but no obvious destructive bony changes to suggest osteomyelitis. IMPRESSION: Bilateral heel ulcers without obvious plain film findings for underlying osteomyelitis. MRI may be helpful for further evaluation. Electronically Signed   By: Rudie Meyer M.D.   On: 12/09/2015 18:34   Dg Foot 2 Views Right  12/09/2015  CLINICAL DATA:  Bilateral heel ulcers. EXAM: LEFT FOOT - 2 VIEW; RIGHT FOOT - 2 VIEW COMPARISON:  None. FINDINGS: Left foot: Mild to moderate degenerative changes most notably at the first metatarsal phalangeal joint. Extensive vascular calcifications are noted. There is a large open wound involving the heel. No obvious destructive bony changes involving the calcaneus to suggest osteomyelitis. Calcaneal spurring changes are noted. Right foot: Mild to moderate degenerative changes most notably at the first metatarsal phalangeal joint. Extensive vascular calcifications. Soft tissue defect involving the heel but no obvious destructive bony changes to suggest osteomyelitis. IMPRESSION: Bilateral heel ulcers without obvious plain film findings for underlying osteomyelitis. MRI may be helpful for further evaluation. Electronically Signed   By: Rudie Meyer M.D.   On: 12/09/2015 18:34  Positive ROS: All other systems have been reviewed and were otherwise negative with the exception of those mentioned in the HPI and as above.  12 point ROS was performed.  Physical Exam: General: Pt not able to provide ROS.  Aphasic  Vascular:  Left foot:Dorsalis Pedis:  absent Posterior Tibial:  absent  Right  foot: Dorsalis Pedis:  absent Posterior Tibial:  absent  Neuro:intact to light touch to lower legs and feet.  C/O pain to ulcer sites  Derm:B/L large necrotic heel ulcerations with foul odor and mild purulence from wounds.  Left lateral foot with some erythema.  No boggy abscess to surrounding skin.  Ortho/MS: Pt in rigid flexion contracture to b/ll lower legs.  Marked pain with any ROM to the lower extremities.  Pt unable to obtain MRI secondary to flexion contracture.   Assessment: Severe PVD with necrotic ulceration  Plan: Agree with vascular this pt is high risk for amputation. WBC elevated and wound appear infected.  High suspicion for osteomyelitis.   Will plan for debridment of ulcers in attempt to decrease infection for any hope of limb salvage.  Plan to OR on Friday.    Irean Hong, DPM Cell [phone]   12/10/2015 5:14 PM

## 2015-12-10 NOTE — Progress Notes (Signed)
ANTIBIOTIC CONSULT NOTE - FOLLOW UP   Pharmacy Consult for piperacillin/tazobactam and vancomycin Indication: Wound infection  No Known Allergies  Patient Measurements: Height: 5' (152.4 cm) Weight: 160 lb (72.576 kg) IBW/kg (Calculated) : 50 Adjusted Body Weight: 59 kg  Vital Signs: Temp: 98.7 F (37.1 C) (02/01 0756) Temp Source: Oral (02/01 0756) BP: 155/82 mmHg (02/01 0756) Pulse Rate: 101 (02/01 0756) Intake/Output from previous day:   Intake/Output from this shift:    Labs:  Recent Labs  12/09/15 1146 12/10/15 1214  WBC 14.0* 11.3*  HGB 13.9 11.7*  PLT 545* 515*  CREATININE 1.06 1.37*   Estimated Creatinine Clearance: 39.5 mL/min (by C-G formula based on Cr of 1.37). No results for input(s): VANCOTROUGH, VANCOPEAK, VANCORANDOM, GENTTROUGH, GENTPEAK, GENTRANDOM, TOBRATROUGH, TOBRAPEAK, TOBRARND, AMIKACINPEAK, AMIKACINTROU, AMIKACIN in the last 72 hours.   Microbiology: Recent Results (from the past 720 hour(s))  Wound culture     Status: None (Preliminary result)   Collection Time: 12/09/15 11:53 PM  Result Value Ref Range Status   Specimen Description FOOT  Final   Special Requests NONE  Final   Gram Stain PENDING  Incomplete   Culture NO GROWTH < 12 HOURS  Final   Report Status PENDING  Incomplete    Medical History: Past Medical History  Diagnosis Date  . CVA (cerebral infarction)   . Hypertension    Assessment: Pharmacy consulted to dose vancomycin and piperacillin/tazobactam for a wound infection in this 75 year old male.   Adjusted body weight: 59 kg CrCl = 51 mL/min  Kinetics: Ke: 0.047 Half-life: 14.7 hours Vd: 41 L  Cmin (estimated) 15.2 mcg/mL   2/1: Serum creatinine now 1.38 mg/dl from 1.0  Goal of Therapy:  Vancomycin trough level 15-20 mcg/ml  Plan:  Measure antibiotic drug levels at steady state Follow up culture results   Vancomycin: Due to patient's renal function changing within 24 hours span, will discontinue VAncomycin  for now and order a random level to be drawn ~18 hours post dose. If patient's random level is within normal range, can continue Vancomycin 750 mg IV q18 hours. If not will need to adjust based on random level result.  Zosyn: Since patient is in AKI, can't accurately measure renal function with CrCl. Will dose Zosyn as if Crcl <10 ml/min; therefore will change to Zosyn 3.375 g IV q12 hours.    Demetrius Charity, PharmD Clinical Pharmacist 12/10/2015,2:35 PM

## 2015-12-10 NOTE — Progress Notes (Signed)
[facility] - [place] at [facility]   PATIENT NAME: Jeremiah Scott    MR#:  [id number]  DATE OF BIRTH:  05-Jan-1941  SUBJECTIVE:   Patient is nonverbal gripping hand rails Could not get MRI as patient cannot lay flat  REVIEW OF SYSTEMS:    Review of Systems  Unable to perform ROS   Tolerating Diet:pureed      DRUG ALLERGIES:  No Known Allergies  VITALS:  Blood pressure 155/82, pulse 101, temperature 98.7 F (37.1 C), temperature source Oral, resp. rate 18, height 5' (1.524 m), weight 72.576 kg (160 lb), SpO2 99 %.  PHYSICAL EXAMINATION:   Physical Exam  Constitutional: He is well-developed, well-nourished, and in no distress. No distress.  HENT:  Head: Normocephalic.  Eyes: No scleral icterus.  Neck: No JVD present. No tracheal deviation present.  Cardiovascular: Normal rate, regular rhythm and normal heart sounds.  Exam reveals no gallop and no friction rub.   No murmur heard. Pulmonary/Chest: Effort normal and breath sounds normal. No respiratory distress. He has no wheezes. He has no rales. He exhibits no tenderness.  Abdominal: Soft. Bowel sounds are normal. He exhibits no distension and no mass. There is no tenderness. There is no rebound and no guarding.  Musculoskeletal: Normal range of motion. He exhibits no edema.  Neurological: He is alert.  Skin: Skin is warm.  Bilateral heels with foul smell and black eschar  Psychiatric:  cognitive behaviors seens      LABORATORY PANEL:   CBC  Recent Labs Lab 12/09/15 1146  WBC 14.0*  HGB 13.9  HCT 42.9  PLT 545*   ------------------------------------------------------------------------------------------------------------------  Chemistries   Recent Labs Lab 12/09/15 1146  NA 134*  K 3.9  CL 100*  CO2 22  GLUCOSE 125*  BUN 21*  CREATININE 1.06  CALCIUM 9.9    ------------------------------------------------------------------------------------------------------------------  Cardiac Enzymes No results for input(s): TROPONINI in the last 168 hours. ------------------------------------------------------------------------------------------------------------------  RADIOLOGY:  Dg Foot 2 Views Left  12/09/2015  CLINICAL DATA:  Bilateral heel ulcers. EXAM: LEFT FOOT - 2 VIEW; RIGHT FOOT - 2 VIEW COMPARISON:  None. FINDINGS: Left foot: Mild to moderate degenerative changes most notably at the first metatarsal phalangeal joint. Extensive vascular calcifications are noted. There is a large open wound involving the heel. No obvious destructive bony changes involving the calcaneus to suggest osteomyelitis. Calcaneal spurring changes are noted. Right foot: Mild to moderate degenerative changes most notably at the first metatarsal phalangeal joint. Extensive vascular calcifications. Soft tissue defect involving the heel but no obvious destructive bony changes to suggest osteomyelitis. IMPRESSION: Bilateral heel ulcers without obvious plain film findings for underlying osteomyelitis. MRI may be helpful for further evaluation. Electronically Signed   By: Rudie Meyer M.D.   On: 12/09/2015 18:34   Dg Foot 2 Views Right  12/09/2015  CLINICAL DATA:  Bilateral heel ulcers. EXAM: LEFT FOOT - 2 VIEW; RIGHT FOOT - 2 VIEW COMPARISON:  None. FINDINGS: Left foot: Mild to moderate degenerative changes most notably at the first metatarsal phalangeal joint. Extensive vascular calcifications are noted. There is a large open wound involving the heel. No obvious destructive bony changes involving the calcaneus to suggest osteomyelitis. Calcaneal spurring changes are noted. Right foot: Mild to moderate degenerative changes most notably at the first metatarsal phalangeal joint. Extensive vascular calcifications. Soft tissue defect involving the heel but no obvious destructive bony changes  to suggest osteomyelitis. IMPRESSION: Bilateral heel ulcers without obvious plain film findings for underlying osteomyelitis. MRI may  be helpful for further evaluation. Electronically Signed   By: Rudie Meyer M.D.   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  1. Bilateral Heel ulcers: Start Zosyn and vancomycin. Wound culture pending. Podiatry, vascular and wound care consult placed. He is  unable to obtain MRI to evaluate for osteomyelitis. Patient cannot lay flat. Foot x-rays do not show evidence of osteo-mellitus. Continue Mepitel silicone contact layer to wound bed. Cover with 4x4 gauze.   2. Essential hypertension:continue nifedipine  3. Hx of CVA: Continue Plavix. Continue. Diet as per speech recommendation. Appreciate speech consultation. Dietary consultation place. Palliative care consultation placed for goals of care and CODE STATUS.   8. Urinary tract infection: Continue Zosyn.    CODE STATUS: FULL  TOTAL TIME TAKING CARE OF THIS PATIENT: 30 minutes.     POSSIBLE D/C 3-4 days, DEPENDING ON CLINICAL CONDITION.   Remie Mathison M.D on 12/10/2015 at 11:58 AM  Between 7am to 6pm - Pager - [phone] After 6pm go to [email or web address] - password [email or web address] [facility]  [facility]  Office  [phone]  CC: Primary care physician; No PCP Per Patient  Note: This dictation was prepared with Dragon dictation along with smaller phrase technology. Any transcriptional errors that result from this process are unintentional.

## 2015-12-10 NOTE — Progress Notes (Addendum)
CHUE, BERKOVICH ([id number]) Visit Report for 12/09/2015 Allergy List Details Patient Name: Jeremiah Scott, Jeremiah Scott Date of Service: 12/09/2015 8:00 AM Medical Record Number: [id number] Patient Account Number: [id number] Date of Birth/Sex: 1941-02-25 (76 y.o. Male) Treating RN: Curtis Sites Primary Care Physician: PATIENT, NO Other Clinician: Referring Physician: Treating Physician/Extender: Rudene Re in Treatment: 0 Allergies Active Allergies No Known Allergies Allergy Notes Electronic Signature(s) Signed: 12/09/2015 4:04:05 PM By: Curtis Sites Entered By: Curtis Sites on 12/09/2015 08:36:53 Jeremiah Scott ([id number]) -------------------------------------------------------------------------------- Arrival Information Details Patient Name: Jeremiah Scott Date of Service: 12/09/2015 8:00 AM Medical Record Number: [id number] Patient Account Number: [id number] Date of Birth/Sex: Jan 06, 1941 (76 y.o. Male) Treating RN: Curtis Sites Primary Care Physician: PATIENT, NO Other Clinician: Referring Physician: Treating Physician/Extender: Rudene Re in Treatment: 0 Visit Information Patient Arrived: Wheel Chair Arrival Time: 08:33 Accompanied By: spouse Transfer Assistance: Manual Patient Identification Verified: Yes Secondary Verification Process Yes Completed: Patient Has Alerts: Yes Patient Alerts: ABI [place] BILATERAL >220 Electronic Signature(s) Signed: 12/09/2015 4:04:05 PM By: Curtis Sites Entered By: Curtis Sites on 12/09/2015 09:01:25 Jeremiah Scott ([id number]) -------------------------------------------------------------------------------- Clinic Level of Care Assessment Details Patient Name: Jeremiah Scott Date of Service: 12/09/2015 8:00 AM Medical Record Number: [id number] Patient Account Number: [id number] Date of Birth/Sex: April 17, 1941 (75 y.o. Male) Treating RN: Curtis Sites Primary Care Physician: PATIENT, NO Other Clinician: Referring  Physician: Treating Physician/Extender: Rudene Re in Treatment: 0 Clinic Level of Care Assessment Items TOOL 2 Quantity Score []  - Use when only an EandM is performed on the INITIAL visit 0 ASSESSMENTS - Nursing Assessment / Reassessment X - General Physical Exam (combine w/ comprehensive assessment (listed just 1 20 below) when performed on new pt. evals) X - Comprehensive Assessment (HX, ROS, Risk Assessments, Wounds Hx, etc.) 1 25 ASSESSMENTS - Wound and Skin Assessment / Reassessment []  - Simple Wound Assessment / Reassessment - one wound 0 X - Complex Wound Assessment / Reassessment - multiple wounds 2 5 []  - Dermatologic / Skin Assessment (not related to wound area) 0 ASSESSMENTS - Ostomy and/or Continence Assessment and Care []  - Incontinence Assessment and Management 0 []  - Ostomy Care Assessment and Management (repouching, etc.) 0 PROCESS - Coordination of Care X - Simple Patient / Family Education for ongoing care 1 15 []  - Complex (extensive) Patient / Family Education for ongoing care 0 X - Staff obtains Chiropractor, Records, Test Results / Process Orders 1 10 []  - Staff telephones HHA, Nursing Homes / Clarify orders / etc 0 []  - Routine Transfer to another Facility (non-emergent condition) 0 []  - Routine Hospital Admission (non-emergent condition) 0 X - New Admissions / Manufacturing engineer / Ordering NPWT, Apligraf, etc. 1 15 []  - Emergency Hospital Admission (emergent condition) 0 X - Simple Discharge Coordination 1 10 Jeremiah Scott, Jeremiah Scott ([id number]) []  - Complex (extensive) Discharge Coordination 0 PROCESS - Special Needs []  - Pediatric / Minor Patient Management 0 []  - Isolation Patient Management 0 []  - Hearing / Language / Visual special needs 0 []  - Assessment of Community assistance (transportation, D/C planning, etc.) 0 []  - Additional assistance / Altered mentation 0 []  - Support Surface(s) Assessment (bed, cushion, seat, etc.) 0 INTERVENTIONS - Wound  Cleansing / Measurement X - Wound Imaging (photographs - any number of wounds) 1 5 []  - Wound Tracing (instead of photographs) 0 []  - Simple Wound Measurement - one wound 0 X - Complex Wound Measurement - multiple wounds 2 5 []  - Simple Wound Cleansing - one wound 0 X - Complex Wound Cleansing -  multiple wounds 2 5 INTERVENTIONS - Wound Dressings X - Small Wound Dressing one or multiple wounds 2 10  - Medium Wound Dressing one or multiple wounds 0  - Large Wound Dressing one or multiple wounds 0  - Application of Medications - injection 0 INTERVENTIONS - Miscellaneous  - External ear exam 0  - Specimen Collection (cultures, biopsies, blood, body fluids, etc.) 0  - Specimen(s) / Culture(s) sent or taken to Lab for analysis 0  - Patient Transfer (multiple staff / Michiel Sites Lift / Similar devices) 0  - Simple Staple / Suture removal (25 or less) 0  - Complex Staple / Suture removal (26 or more) 0 Jeremiah Scott, Jeremiah Scott ([id number])  - Hypo / Hyperglycemic Management (close monitor of Blood Glucose) 0 X - Ankle / Brachial Index (ABI) - do not check if billed separately 1 15 Has the patient been seen at the hospital within the last three years: Yes Total Score: 165 Level Of Care: New/Established - Level 5 Electronic Signature(s) Signed: 12/09/2015 4:04:05 PM By: Curtis Sites Entered By: Curtis Sites on 12/09/2015 09:29:46 Jeremiah Scott ([id number]) -------------------------------------------------------------------------------- Encounter Discharge Information Details Patient Name: Jeremiah Scott Date of Service: 12/09/2015 8:00 AM Medical Record Number: [id number] Patient Account Number: [id number] Date of Birth/Sex: 01-28-1941 (75 y.o. Male) Treating RN: Curtis Sites Primary Care Physician: PATIENT, NO Other Clinician: Referring Physician: Treating Physician/Extender: Rudene Re in Treatment: 0 Encounter Discharge Information Items Discharge Pain Level:  0 Discharge Condition: Stable Ambulatory Status: Wheelchair Emergency Discharge Destination: Room Transportation: Private Auto Accompanied By: family Schedule Follow-up Appointment: Yes Medication Reconciliation completed and provided to Patient/Care No Sarahelizabeth Conway: Provided on Clinical Summary of Care: 12/09/2015 Form Type Recipient Paper Patient [place] Electronic Signature(s) Signed: 12/09/2015 3:42:10 PM By: Curtis Sites Previous Signature: 12/09/2015 9:41:40 AM Version By: Gwenlyn Perking Entered By: Curtis Sites on 12/09/2015 15:42:10 Jeremiah Scott ([id number]) -------------------------------------------------------------------------------- Lower Extremity Assessment Details Patient Name: Jeremiah Scott Date of Service: 12/09/2015 8:00 AM Medical Record Number: [id number] Patient Account Number: [id number] Date of Birth/Sex: 17-Dec-1940 (75 y.o. Male) Treating RN: Curtis Sites Primary Care Physician: PATIENT, NO Other Clinician: Referring Physician: Treating Physician/Extender: Rudene Re in Treatment: 0 Edema Assessment Assessed: [Left: No] [Right: No] Edema: [Left: Yes] [Right: Yes] Vascular Assessment Pulses: Posterior Tibial Palpable: [Left:Yes] [Right:Yes] Doppler: [Left:Monophasic] [Right:Monophasic] Dorsalis Pedis Palpable: [Left:Yes] [Right:Yes] Doppler: [Left:Monophasic] [Right:Monophasic] Extremity colors, hair growth, and conditions: Extremity Color: [Left:Hyperpigmented] [Right:Hyperpigmented] Hair Growth on Extremity: [Left:No] [Right:No] Temperature of Extremity: [Left:Warm] [Right:Warm] Capillary Refill: [Left:< 3 seconds] [Right:< 3 seconds] Toe Nail Assessment Left: Right: Thick: Yes Yes Discolored: Yes Yes Deformed: No No Improper Length and Hygiene: No No Notes ABI [place] BILATERAL > 220 Electronic Signature(s) Signed: 12/09/2015 4:04:05 PM By: Curtis Sites Entered By: Curtis Sites on 12/09/2015 09:01:10 Jeremiah Scott  ([id number]) -------------------------------------------------------------------------------- Multi Wound Chart Details Patient Name: Jeremiah Scott Date of Service: 12/09/2015 8:00 AM Medical Record Number: [id number] Patient Account Number: [id number] Date of Birth/Sex: 1941-10-20 (75 y.o. Male) Treating RN: Curtis Sites Primary Care Physician: PATIENT, NO Other Clinician: Referring Physician: Treating Physician/Extender: Rudene Re in Treatment: 0 Vital Signs Height(in): Pulse(bpm): 115 Weight(lbs): Blood Pressure 119/55 (mmHg): Body Mass Index(BMI): Temperature(F): 98.1 Respiratory Rate 16 (breaths/min): Photos: [1:No Photos] [2:No Photos] [N/A:N/A] Wound Location: [1:Left Calcaneus] [2:Right Calcaneus] [N/A:N/A] Wounding Event: [1:Gradually Appeared] [2:Gradually Appeared] [N/A:N/A] Primary Etiology: [1:Pressure Ulcer] [2:Pressure Ulcer] [N/A:N/A] Comorbid History: [1:Hypertension, Osteoarthritis, Dementia] [2:Hypertension, Osteoarthritis, Dementia] [N/A:N/A] Date Acquired: [1:10/06/2015] [2:10/06/2015] [N/A:N/A] Weeks of Treatment: [1:0] [2:0] [N/A:N/A] Wound Status: [1:Open] [2:Open] [N/A:N/A] Pending Amputation  on Yes [2:Yes] [N/A:N/A] Presentation: Measurements L x W x D 6x9x0.2 [2:4.7x4.7x0.3] [N/A:N/A] (cm) Area (cm) : [1:42.412] [2:17.349] [N/A:N/A] Volume (cm) : [1:8.482] [2:5.205] [N/A:N/A] Classification: [1:Unstageable/Unclassified] [2:Unstageable/Unclassified] [N/A:N/A] Exudate Amount: [1:Medium] [2:Large] [N/A:N/A] Exudate Type: [1:Serous] [2:Serosanguineous] [N/A:N/A] Exudate Color: [1:amber] [2:red, brown] [N/A:N/A] Foul Odor After [1:Yes] [2:Yes] [N/A:N/A] Cleansing: Odor Anticipated Due to No [2:No] [N/A:N/A] Product Use: Wound Margin: [1:Flat and Intact] [2:Flat and Intact] [N/A:N/A] Granulation Amount: [1:None Present (0%)] [2:Small (1-33%)] [N/A:N/A] Granulation Quality: [1:N/A] [2:Pink] [N/A:N/A] Necrotic Amount: [1:Large  (67-100%)] [2:Large (67-100%)] [N/A:N/A] Necrotic Tissue: [1:Eschar] [2:Eschar] [N/A:N/A] Exposed Structures: [1:Fascia: No Fat: No] [2:Fascia: No Fat: No] [N/A:N/A] Tendon: No Tendon: No Muscle: No Muscle: No Joint: No Joint: No Bone: No Bone: No Limited to Skin Limited to Skin Breakdown Breakdown Epithelialization: None None N/A Periwound Skin Texture: Edema: Yes Edema: Yes N/A Excoriation: No Excoriation: No Induration: No Induration: No Callus: No Callus: No Crepitus: No Crepitus: No Fluctuance: No Fluctuance: No Friable: No Friable: No Rash: No Rash: No Scarring: No Scarring: No Periwound Skin Maceration: No Maceration: No N/A Moisture: Moist: No Moist: No Dry/Scaly: No Dry/Scaly: No Periwound Skin Color: Erythema: Yes Erythema: Yes N/A Atrophie Blanche: No Atrophie Blanche: No Cyanosis: No Cyanosis: No Ecchymosis: No Ecchymosis: No Hemosiderin Staining: No Hemosiderin Staining: No Mottled: No Mottled: No Pallor: No Pallor: No Rubor: No Rubor: No Erythema Location: Circumferential Circumferential N/A Tenderness on Yes Yes N/A Palpation: Wound Preparation: Ulcer Cleansing: Ulcer Cleansing: N/A Rinsed/Irrigated with Rinsed/Irrigated with Saline Saline Topical Anesthetic Topical Anesthetic Applied: Other: lidocaine Applied: Other: lidocaine 4% 4% Treatment Notes Electronic Signature(s) Signed: 12/09/2015 4:04:05 PM By: Curtis Sites Entered By: Curtis Sites on 12/09/2015 09:25:43 Jeremiah Scott ([id number]) -------------------------------------------------------------------------------- Multi-Disciplinary Care Plan Details Patient Name: Jeremiah Scott Date of Service: 12/09/2015 8:00 AM Medical Record Number: [id number] Patient Account Number: [id number] Date of Birth/Sex: Mar 04, 1941 (75 y.o. Male) Treating RN: Curtis Sites Primary Care Physician: PATIENT, NO Other Clinician: Referring Physician: Treating Physician/Extender: Rudene Re in Treatment: 0 Active Inactive Electronic Signature(s) Signed: 01/13/2016 1:26:27 PM By: Curtis Sites Previous Signature: 12/09/2015 4:04:05 PM Version By: Curtis Sites Entered By: Curtis Sites on 01/13/2016 13:26:27 Jeremiah Scott ([id number]) -------------------------------------------------------------------------------- Patient/Caregiver Education Details Patient Name: Jeremiah Scott Date of Service: 12/09/2015 8:00 AM Medical Record Number: [id number] Patient Account Number: [id number] Date of Birth/Gender: 10/03/1941 (75 y.o. Male) Treating RN: Curtis Sites Primary Care Physician: PATIENT, NO Other Clinician: Referring Physician: Treating Physician/Extender: Rudene Re in Treatment: 0 Education Assessment Education Provided To: Patient and Caregiver Education Topics Provided Wound/Skin Impairment: Handouts: Other: go to ED r/t wound infection Methods: Explain/Verbal Responses: State content correctly Electronic Signature(s) Signed: 12/09/2015 3:42:31 PM By: Curtis Sites Entered By: Curtis Sites on 12/09/2015 15:42:31 Jeremiah Scott ([id number]) -------------------------------------------------------------------------------- Wound Assessment Details Patient Name: Jeremiah Scott Date of Service: 12/09/2015 8:00 AM Medical Record Number: [id number] Patient Account Number: [id number] Date of Birth/Sex: 1941-01-05 (75 y.o. 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 Wound Status: Open Wounding Event: Gradually Appeared Comorbid Hypertension, Osteoarthritis, Date Acquired: 10/06/2015 History: Dementia Weeks Of Treatment: 0 Clustered Wound: No Pending Amputation On Presentation Photos Photo Uploaded By: Curtis Sites on  12/09/2015 15:30:42 Wound Measurements Length: (cm) 6 Width: (cm) 9 Depth: (cm) 0.2 Area: (cm) 42.412 Volume: (cm) 8.482 % Reduction in Area: % Reduction in Volume: Epithelialization: None Tunneling: No Undermining: No Wound Description  Classification: Unstageable/Unclassified Foul Odor Aft Wound Margin: Flat and Intact Due to Produc Exudate Amount: Medium Exudate Type: Serous Exudate Color: amber er Cleansing: Yes t Use: No Wound Bed Granulation Amount: None Present (0%) Exposed Structure Necrotic Amount: Large (67-100%) Fascia Exposed: No Necrotic Quality: Eschar Fat Layer Exposed: No Jeremiah Scott, Jeremiah Scott ([id number]) Tendon Exposed: No Muscle Exposed: No Joint Exposed: No Bone Exposed: No Limited to Skin Breakdown Periwound Skin Texture Texture Color No Abnormalities Noted: No No Abnormalities Noted: No Callus: No Atrophie Blanche: No Crepitus: No Cyanosis: No Excoriation: No Ecchymosis: No Fluctuance: No Erythema: Yes Friable: No Erythema Location: Circumferential Induration: No Hemosiderin Staining: No Localized Edema: Yes Mottled: No Rash: No Pallor: No Scarring: No Rubor: No Moisture Temperature / Pain No Abnormalities Noted: No Tenderness on Palpation: Yes Dry / Scaly: No Maceration: No Moist: No Wound Preparation Ulcer Cleansing: Rinsed/Irrigated with Saline Topical Anesthetic Applied: Other: lidocaine 4%, Electronic Signature(s) Signed: 12/09/2015 4:04:05 PM By: Curtis Sites Entered By: Curtis Sites on 12/09/2015 08:53:24 Jeremiah Scott ([id number]) -------------------------------------------------------------------------------- Wound Assessment Details Patient Name: Jeremiah Scott Date of Service: 12/09/2015 8:00 AM Medical Record Number: [id number] Patient Account Number: [id number] Date of Birth/Sex: [birth year] (75 y.o. Male) Treating RN: Curtis Sites Primary Care Physician: PATIENT, NO Other Clinician: Referring  Physician: Treating Physician/Extender: Rudene Re in Treatment: 0 Wound Status Wound Number: 2 Primary Pressure Ulcer Etiology: Wound Location: Right Calcaneus Wound Status: Open Wounding Event: Gradually Appeared Comorbid Hypertension, Osteoarthritis, Date Acquired: 10/06/2015 History: Dementia Weeks Of Treatment: 0 Clustered Wound: No Pending Amputation On Presentation Photos Photo Uploaded By: Curtis Sites on 12/09/2015 15:30:42 Wound Measurements Length: (cm) 4.7 Width: (cm) 4.7 Depth: (cm) 0.3 Area: (cm) 17.349 Volume: (cm) 5.205 % Reduction in Area: % Reduction in Volume: Epithelialization: None Tunneling: No Undermining: No Wound Description Classification: Unstageable/Unclassified Foul Odor Aft Wound Margin: Flat and Intact Due to Produc Exudate Amount: Large Exudate Type: Serosanguineous Exudate Color: red, brown er Cleansing: Yes t Use: No Wound Bed Granulation Amount: Small (1-33%) Exposed Structure Granulation Quality: Pink Fascia Exposed: No Necrotic Amount: Large (67-100%) Fat Layer Exposed: No Jeremiah Scott, Jeremiah Scott ([id number]) Necrotic Quality: Eschar Tendon Exposed: No Muscle Exposed: No Joint Exposed: No Bone Exposed: No Limited to Skin Breakdown Periwound Skin Texture Texture Color No Abnormalities Noted: No No Abnormalities Noted: No Callus: No Atrophie Blanche: No Crepitus: No Cyanosis: No Excoriation: No Ecchymosis: No Fluctuance: No Erythema: Yes Friable: No Erythema Location: Circumferential Induration: No Hemosiderin Staining: No Localized Edema: Yes Mottled: No Rash: No Pallor: No Scarring: No Rubor: No Moisture Temperature / Pain No Abnormalities Noted: No Tenderness on Palpation: Yes Dry / Scaly: No Maceration: No Moist: No Wound Preparation Ulcer Cleansing: Rinsed/Irrigated with Saline Topical Anesthetic Applied: Other: lidocaine 4%, Electronic Signature(s) Signed: 12/09/2015 4:04:05 PM By: Curtis Sites Entered By: Curtis Sites on 12/09/2015 08:55:19 Jeremiah Scott ([id number]) -------------------------------------------------------------------------------- Vitals Details Patient Name: Jeremiah Scott Date of Service: 12/09/2015 8:00 AM Medical Record Number: [id number] Patient Account Number: [id number] Date of Birth/Sex: [birth year] (75 y.o. Male) Treating RN: Curtis Sites Primary Care Physician: PATIENT, NO Other Clinician: Referring Physician: Treating Physician/Extender: Rudene Re in Treatment: 0 Vital Signs Time Taken: 08:35 Temperature (F): 98.1 Pulse (bpm): 115 Respiratory Rate (breaths/min): 16 Blood Pressure (mmHg): 119/55 Reference Range: 80 - 120 mg / dl Electronic Signature(s) Signed: 12/09/2015 4:04:05 PM By: Curtis Sites Entered By: Curtis Sites on 12/09/2015 08:36:36

## 2015-12-10 NOTE — Progress Notes (Signed)
Patients daughter and wife at bedside since lunch.  Both speak english and informed me that patient speaks english as well as another language but due to his current decline in health he has stopped communicating very much and doesn't cooperate as much as he use to.  Attempted to do MRI with Ativan to calm patient and patient was unable to tolerate laying flat to have exam.  MRI canceled.    Dressed wounds per wound consult nurse and patient was obviously in pain during dressing and prevalon boot application.  MD rounded and removed dressing except for Mepitel.  Didn't stress patient further by reapplying dressing as pending podiatry and vascular to see patient and they will also need to undress wounds.    Speech and swallow eval completed by Natalia Leatherwood.  Advised to use puree foods and to pinch the straw during drinking to limit the amount taken at one time.  Family instructed and patient tolerating foods well with their assistance.

## 2015-12-10 NOTE — Progress Notes (Addendum)
Jeremiah, Scott ([id number]) Visit Report for 12/09/2015 Chief Complaint Document Details Patient Name: Jeremiah Scott, Jeremiah Scott Date of Service: 12/09/2015 8:00 AM Medical Record Number: [id number] Patient Account Number: [id number] Date of Birth/Sex: 1941-10-12 (75 y.o. Male) Treating RN: Curtis Sites Primary Care Physician: PATIENT, NO Other Clinician: Referring Physician: Treating Physician/Extender: Rudene Re in Treatment: 0 Information Obtained from: Patient Chief Complaint Patient is at the clinic for treatment of an open pressure ulcer bilateral heels which she's had for over a month Electronic Signature(s) Signed: 12/09/2015 9:41:54 AM By: Evlyn Kanner MD, FACS Entered By: Evlyn Kanner on 12/09/2015 09:41:53 Jeremiah Scott ([id number]) -------------------------------------------------------------------------------- HPI Details Patient Name: Jeremiah Scott Date of Service: 12/09/2015 8:00 AM Medical Record Number: [id number] Patient Account Number: [id number] Date of Birth/Sex: 1941-04-08 (75 y.o. Male) Treating RN: Curtis Sites Primary Care Physician: PATIENT, NO Other Clinician: Referring Physician: Treating Physician/Extender: Rudene Re in Treatment: 0 History of Present Illness Location: both heels are black and very smelly Quality: Patient reports experiencing a sharp pain to affected area(s). Severity: Patient states wound are getting worse. Duration: Patient has had the wound for < 6 weeks prior to presenting for treatment Timing: Pain in wound is constant (hurts all the time) Context: The wound appeared gradually over time Modifying Factors: Other treatment(s) tried include: they have been applying Bactroban ointment locally but not doing any other dressings Associated Signs and Symptoms: Patient reports having:been unable to walk for over a month and has been laying in bed HPI Description: 75 year old gentleman known to have a stroke over 5 years ago has  been walking up till November of last year but now has been bedbound for quite a while. The daughter and wife are the caregivers and they say he's been progressively getting a black area on his heels which are now smelling quite profusely and they were unable to get an appointment with the wound clinic at [facility] and hence tried to get him to see [place] here. I do not know if he's had diabetes in the past and the last office note from a clinic had some blood work done which did not show significant elevation of his blood glucose. 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 He also had some orthopedic problem with his knees which was treated at [facility] orth[facility]. He has not been a smoker all his life. Electronic Signature(s) Signed: 12/15/2015 8:46:57 AM By: Evlyn Kanner MD, FACS Previous Signature: 12/09/2015 9:44:56 AM Version By: Evlyn Kanner MD, FACS Entered By: Evlyn Kanner on 12/15/2015 08:46:57 Jeremiah Scott ([id number]) -------------------------------------------------------------------------------- Physical Exam Details Patient Name: Jeremiah Scott Date of Service: 12/09/2015 8:00 AM Medical Record Number: [id number] Patient Account Number: [id number] Date of Birth/Sex: April 30, 1941 (75 y.o. Male) Treating RN: Curtis Sites Primary Care Physician: PATIENT, NO Other Clinician: Referring Physician: Treating Physician/Extender: Rudene Re in Treatment: 0 Eyes Nonicteric. Reactive to light. Ears, Nose, Mouth, and Throat Lips, teeth, and gums WNL.Marland Kitchen Moist mucosa without lesions. Neck supple and nontender. No palpable supraclavicular or cervical adenopathy. Normal sized without goiter. Respiratory WNL. No retractions.. Cardiovascular Pedal Pulses WNL. monophasic signals and noncompressible blood vessels. No clubbing, cyanosis or edema. Gastrointestinal (GI) Abdomen without masses or tenderness.. No liver or spleen enlargement or  tenderness.. Lymphatic No adneopathy. No adenopathy. No adenopathy. Musculoskeletal Adexa without tenderness or enlargement.. Digits and nails w/o clubbing, cyanosis, infection, petechiae, ischemia, or inflammatory conditions.. Integumentary (Hair, Skin) No suspicious lesions. No crepitus or fluctuance. No peri-wound warmth or erythema. No masses.Marland Kitchen Psychiatric  Judgement and insight Intact.. No evidence of depression, anxiety, or agitation.. 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 He has monophasic pulses and ABI cannot be measured Electronic Signature(s) Signed: 12/09/2015 9:46:10 AM By: Evlyn Kanner MD, FACS Entered By: Evlyn Kanner on 12/09/2015 09:46:09 Jeremiah Scott ([id number]) -------------------------------------------------------------------------------- Physician Orders Details Patient Name: Jeremiah Scott Date of Service: 12/09/2015 8:00 AM Medical Record Number: [id number] Patient Account Number: [id number] Date of Birth/Sex: 1940/11/28 (75 y.o. Male) Treating RN: Curtis Sites Primary Care Physician: PATIENT, NO Other Clinician: Referring Physician: Treating Physician/Extender: Rudene Re in Treatment: 0 Verbal / Phone Orders: Yes Clinician: Curtis Sites Read Back and Verified: Yes Diagnosis Coding Wound Cleansing Wound #1 Left Calcaneus o Clean wound with Normal Saline. Wound #2 Right Calcaneus o Clean wound with Normal Saline. Anesthetic Wound #1 Left Calcaneus o Topical Lidocaine 4% cream applied to wound bed prior to debridement Wound #2 Right Calcaneus o Topical Lidocaine 4% cream applied to wound bed prior to debridement Primary Wound Dressing Wound #1 Left Calcaneus o ABD Pad Wound #2 Right Calcaneus o ABD Pad Secondary Dressing Wound #1 Left Calcaneus o Conform/Kerlix Wound #2 Right  Calcaneus o Conform/Kerlix Dressing Change Frequency Wound #1 Left Calcaneus o Change dressing every day. Wound #2 Right Calcaneus o Change dressing every day. Follow-up Appointments SHAQUILL, ISEMAN ([id number]) Wound #1 Left Calcaneus o Other: - return after hospital stay Wound #2 Right Calcaneus o Other: - return after hospital stay Notes Patient to go to [facility] ED R/T infected wounds and return after hospital stay Electronic Signature(s) Signed: 12/09/2015 3:33:01 PM By: Evlyn Kanner MD, FACS Signed: 12/09/2015 4:04:05 PM By: Curtis Sites Entered By: Curtis Sites on 12/09/2015 09:28:52 Jeremiah Scott ([id number]) -------------------------------------------------------------------------------- Problem List Details Patient Name: Jeremiah Scott Date of Service: 12/09/2015 8:00 AM Medical Record Number: [id number] Patient Account Number: [id number] Date of Birth/Sex: October 13, 1941 (75 y.o. 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 Male) Treating RN: Curtis Sites Primary Care Physician: PATIENT, NO Other Clinician: Referring Physician: Treating  Physician/Extender: Rudene Re in Treatment: 0 Subjective Chief Complaint Information obtained from Patient Patient is at the clinic for treatment of an open pressure ulcer bilateral heels which she's had for over a month History of Present Illness (HPI) The following HPI elements were documented for the patient's wound: Location: both heels are black and very smelly Quality: Patient reports experiencing a sharp pain to affected area(s). Severity: Patient states wound are getting worse. Duration: Patient has had the wound for < 6 weeks prior to presenting for treatment Timing: Pain in wound is constant (hurts all the time) Context: The wound appeared gradually over time Modifying Factors: Other treatment(s) tried include: they have been applying Bactroban ointment locally but not doing any other dressings Associated Signs and Symptoms: Patient reports  having:been unable to walk for over a month and has been laying in bed 75 year old gentleman known to have a stroke over 5 years ago has been walking up till November of last year but now has been bedbound for quite a while. The daughter and wife are the caregivers and they say he's been progressively getting a black area on his heels which are now smelling quite profusely and they were unable to get an appointment with the wound clinic at [facility] and hence tried to get him to see [place] here. I do not know if he's had diabetes in the past and the last office note from a clinic had some blood work done which did not show significant elevation of his blood glucose. 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 He also had some orthopedic problem with his knees which was treated at [facility] orth[facility]. He has not been a smoker all his life. Wound History Patient presents with 2 open wounds that have been present for approximately 2 months. Patient has been treating wounds in the following  manner: bactroban ointment. Laboratory tests have been performed in the last month. Patient reportedly has not tested positive for an antibiotic resistant organism. Patient reportedly has not tested positive for osteomyelitis. Patient reportedly has not had testing performed to evaluate circulation in the legs. Patient experiences the following problems associated with their wounds: infection. Patient History Information obtained from Patient. TIEGAN, TERPSTRA ([id number]) Allergies No Known Allergies Social History Never smoker, Marital Status - Married, Alcohol Use - Never, Drug Use - No History, Caffeine Use - Never. Medical History Cardiovascular Patient has history of Hypertension Musculoskeletal Patient has history of Osteoarthritis Neurologic Patient has history of Dementia Medical And Surgical History Notes Neurologic CVA with aphasia in 2010 Review of Systems (ROS) Constitutional Symptoms (General Health) The patient has no complaints or symptoms. Eyes The patient has no complaints or symptoms. Ear/Nose/Mouth/Throat The patient has no complaints or symptoms. Hematologic/Lymphatic The patient has no complaints or symptoms. Respiratory The patient has no complaints or symptoms. Cardiovascular The patient has no complaints or symptoms. Gastrointestinal The patient has no complaints or symptoms. Endocrine The patient has no complaints or symptoms. Genitourinary Complains or has symptoms of Kidney failure/ Dialysis - CKD. Immunological The patient has no complaints or symptoms. Integumentary (Skin) The patient has no complaints or symptoms. Musculoskeletal The patient has no complaints or symptoms. Oncologic The patient has no complaints or symptoms. Psychiatric The patient has no complaints or symptoms. VINTON, LAYSON ([id number]) Objective Constitutional Vitals Time Taken: 8:35 AM, Temperature: 98.1 F, Pulse: 115 bpm, Respiratory Rate: 16 breaths/min, Blood  Pressure: 119/55 mmHg. Eyes Nonicteric. Reactive to light. Ears, Nose, Mouth, and Throat Lips, teeth, and gums WNL.Marland Kitchen Moist mucosa without lesions. Neck supple and nontender. No palpable supraclavicular or cervical adenopathy. Normal sized without goiter. Respiratory WNL. No retractions.. Cardiovascular Pedal Pulses WNL. monophasic signals and noncompressible blood vessels. No clubbing, cyanosis or edema. Gastrointestinal (GI) Abdomen without masses or tenderness.. No liver or spleen enlargement or tenderness.. Lymphatic No adneopathy. No adenopathy. No adenopathy. Musculoskeletal Adexa without tenderness or enlargement.. Digits and nails w/o clubbing, cyanosis, infection, petechiae, ischemia, or inflammatory conditions.Marland Kitchen Psychiatric Judgement and insight Intact.. No evidence of depression, anxiety, or agitation.. 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 He has monophasic pulses and ABI cannot be measured Integumentary (  Hair, Skin) No suspicious lesions. No crepitus or fluctuance. No peri-wound warmth or erythema. No masses.. Wound #1 status is Open. Original cause of wound was Gradually Appeared. The wound is located on the Left Calcaneus. The wound measures 6cm length x 9cm width x 0.2cm depth; 42.412cm^2 area and 8.482cm^3 volume. The wound is limited to skin breakdown. There is no tunneling or undermining noted. MONTE, Connye Burkitt ([id number]) There is a medium amount of serous drainage noted. The wound margin is flat and intact. There is no granulation within the wound bed. There is a large (67-100%) amount of necrotic tissue within the wound bed including Eschar. The periwound skin appearance exhibited: Localized Edema, Erythema. The periwound skin appearance did not exhibit: Callus, Crepitus, Excoriation, Fluctuance, Friable,  Induration, Rash, Scarring, Dry/Scaly, Maceration, Moist, Atrophie Blanche, Cyanosis, Ecchymosis, Hemosiderin Staining, Mottled, Pallor, Rubor. The surrounding wound skin color is noted with erythema which is circumferential. The periwound has tenderness on palpation. Wound #2 status is Open. Original cause of wound was Gradually Appeared. The wound is located on the Right Calcaneus. The wound measures 4.7cm length x 4.7cm width x 0.3cm depth; 17.349cm^2 area and 5.205cm^3 volume. The wound is limited to skin breakdown. There is no tunneling or undermining noted. There is a large amount of serosanguineous drainage noted. The wound margin is flat and intact. There is small (1-33%) pink granulation within the wound bed. There is a large (67-100%) amount of necrotic tissue within the wound bed including Eschar. The periwound skin appearance exhibited: Localized Edema, Erythema. The periwound skin appearance did not exhibit: Callus, Crepitus, Excoriation, Fluctuance, Friable, Induration, Rash, Scarring, Dry/Scaly, Maceration, Moist, Atrophie Blanche, Cyanosis, Ecchymosis, Hemosiderin Staining, Mottled, Pallor, Rubor. The surrounding wound skin color is noted with erythema which is circumferential. The periwound has tenderness on palpation. 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 He will need appropriate workup and debridement in the operating room under anesthesia and his best option is to go to the ER at [facility] and get the appropriate workup and treatment plan done. His daughter and wife are quite oblivious of the fact  that these are pressure ulcers with an advanced stage and I have discussed this with them Plan NIEVES, BARBERI ([id number]) Wound Cleansing: Wound #1 Left Calcaneus: Clean wound with Normal Saline. Wound #2 Right Calcaneus: Clean wound with Normal Saline. Anesthetic: Wound #1 Left Calcaneus: Topical Lidocaine 4% cream applied to wound bed prior to debridement Wound #2 Right Calcaneus: Topical Lidocaine 4% cream applied to wound bed prior to debridement Primary Wound Dressing: Wound #1 Left Calcaneus: ABD Pad Wound #2 Right Calcaneus: ABD Pad Secondary Dressing: Wound #1 Left Calcaneus: Conform/Kerlix Wound #2 Right Calcaneus: Conform/Kerlix Dressing Change Frequency: Wound #1 Left Calcaneus: Change dressing every day. Wound #2 Right Calcaneus: Change dressing every day. Follow-up Appointments: Wound #1 Left Calcaneus: Other: - return after hospital stay Wound #2 Right Calcaneus: Other: - return after hospital stay General Notes: Patient to go to [facility] ED R/T infected wounds and return after hospital stay This elderly gentleman who is bedbound due to her previous stroke now comes with advanced stage IV pressure ulcers both heels which have significant amount of  gangrene and his management is beyond the scope of outpatient therapy. He will need appropriate workup and debridement in the operating room under anesthesia and his best option is to go to the ER at [facility] and get the appropriate workup and treatment plan done. His daughter and wife are quite oblivious of the fact that these are pressure ulcers with an advanced stage and I have discussed this with them PIKE, [place] ([id number]) Electronic Signature(s) Signed: 12/15/2015 4:19:48 PM By: Evlyn Kanner MD, FACS Previous Signature: 12/09/2015 9:48:25 AM Version By: Evlyn Kanner MD, FACS Entered By: Evlyn Kanner on 12/15/2015 16:19:48 Jeremiah Scott  ([id number]) -------------------------------------------------------------------------------- ROS/PFSH Details Patient Name: Jeremiah Scott Date of Service: 12/09/2015 8:00 AM Medical Record Number: [id number] Patient Account Number: [id number] Date of Birth/Sex: May 02, 1941 (75 y.o. Male) Treating RN: Curtis Sites Primary Care Physician: PATIENT, NO Other Clinician: Referring Physician: Treating Physician/Extender: Rudene Re in Treatment: 0 Information Obtained From Patient Wound History Do you currently have one or more open woundso Yes How many open wounds do you currently haveo 2 Approximately how long have you had your woundso 2 months How have you been treating your wound(s) until nowo bactroban ointment Has your wound(s) ever healed and then re-openedo No Have you had any lab work done in the past montho Yes Who ordered the lab work doneo PCP Have you tested positive for an antibiotic resistant organism (MRSA, VRE)o No Have you tested positive for osteomyelitis (bone infection)o No Have you had any tests for circulation on your legso No Have you had other problems associated with your woundso Infection Genitourinary Complaints and Symptoms: Positive for: Kidney failure/ Dialysis - CKD Constitutional Symptoms (General Health) Complaints and Symptoms: No Complaints or Symptoms Eyes Complaints and Symptoms: No Complaints or Symptoms Ear/Nose/Mouth/Throat Complaints and Symptoms: No Complaints or Symptoms Hematologic/Lymphatic Complaints and Symptoms: No Complaints or Symptoms Respiratory ISHAQ, MAFFEI ([id number]) Complaints and Symptoms: No Complaints or Symptoms Cardiovascular Complaints and Symptoms: No Complaints or Symptoms Medical History: Positive for: Hypertension Gastrointestinal Complaints and Symptoms: No Complaints or Symptoms Endocrine Complaints and Symptoms: No Complaints or Symptoms Immunological Complaints and Symptoms: No  Complaints or Symptoms Integumentary (Skin) Complaints and Symptoms: No Complaints or Symptoms Musculoskeletal Complaints and Symptoms: No Complaints or Symptoms Medical History: Positive for: Osteoarthritis Neurologic Medical History: Positive for: Dementia Past Medical History Notes: CVA with aphasia in 2010 Oncologic Complaints and Symptoms: No Complaints or Symptoms Psychiatric RECO, SHONK ([id number]) Complaints and Symptoms: No Complaints or Symptoms Immunizations Immunization Notes: up to date Family and Social History Never smoker; Marital Status - Married; Alcohol Use: Never; Drug Use: No History; Caffeine Use: Never; Financial Concerns: No; Food, Clothing or Shelter Needs: No; Support System Lacking: No; Transportation Concerns: No; Advanced Directives: No; Patient does not want information on Advanced Directives Physician Affirmation I have reviewed and agree with the above information. Electronic Signature(s) Signed: 12/09/2015 9:18:20 AM By: Evlyn Kanner MD, FACS Signed: 12/09/2015 4:04:05 PM By: Curtis Sites Entered By: Evlyn Kanner on 12/09/2015 09:18:19 Jeremiah Scott ([id number]) -------------------------------------------------------------------------------- SuperBill Details Patient Name: Jeremiah Scott Date of Service: 12/09/2015 Medical Record Number: [id number] Patient Account Number: [id number] Date of Birth/Sex: Apr 13, 1941 (75 y.o. 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

## 2015-12-10 NOTE — Consult Note (Signed)
WOC wound consult note Reason for Consult: Bilateral heel ulcers, history of CVA with aphasia.   Wound type:Bilateral unstageable heel pressure injury, present on admission Pressure Ulcer POA: Yes Measurement:Left heel 3.2 cm x 4.5 cm wound bed is 100% eschar, wound perimeter is separated circumferentially.  Right heel (includes medial and posterior aspect) 4 cm x 6 cm wound bed is 100% eschar, eschar is separated from wound perimeter circumferentially. Wound bed:100% eschar, separated at wound perimeter.   Drainage (amount, consistency, odor) Moderate serosanguinous drainage  Foul, necrotic odor Periwound:Erythema circumferntially and eschar has separated from wound perimeter.  Dressing procedure/placement/frequency:Patient is going for MRI at this time. Once returns, Mepitel silicone contact layer to wound bed.  Cover with 4x4 gauze.  Change every other day.  Will await MRI results and pending consults.  WOC team will follow.  Maple Hudson RN BSN CWON Pager [phone]

## 2015-12-10 NOTE — Progress Notes (Addendum)
Patient currently npo pending speech swallow evaluation.  Patient cognitive level unable to assess, does not communicate verbally.  Patient lays in bed on right side with head resting on side rail, attempt to reposition head for comfort and patient immediately returns to resting head of side rail laying in bed sideways.  No family present to translate. Unsure if patient would be able to communicate with help of translator and unsure of language to translate.  Patient needs to be relocated closer to nursing station for safety since he is non communicative for closer observation. Attempting to relocate.  Podiatry and wound consult pending.  Attempting to get a estimated arrival time for MD roundings to coordinate family presence for information and possible communication.   Foul odor present in the room from bilateral heel ulcers.

## 2015-12-11 LAB — BASIC METABOLIC PANEL
ANION GAP: 7 (ref 5–15)
BUN: 19 mg/dL (ref 6–20)
CALCIUM: 8.9 mg/dL (ref 8.9–10.3)
CO2: 25 mmol/L (ref 22–32)
Chloride: 109 mmol/L (ref 101–111)
Creatinine, Ser: 1.11 mg/dL (ref 0.61–1.24)
GLUCOSE: 105 mg/dL — AB (ref 65–99)
POTASSIUM: 3.3 mmol/L — AB (ref 3.5–5.1)
Sodium: 141 mmol/L (ref 135–145)

## 2015-12-11 LAB — CBC
HEMATOCRIT: 32.6 % — AB (ref 40.0–52.0)
HEMOGLOBIN: 10.8 g/dL — AB (ref 13.0–18.0)
MCH: 29.3 pg (ref 26.0–34.0)
MCHC: 33.3 g/dL (ref 32.0–36.0)
MCV: 88 fL (ref 80.0–100.0)
Platelets: 439 10*3/uL (ref 150–440)
RBC: 3.71 MIL/uL — AB (ref 4.40–5.90)
RDW: 13.5 % (ref 11.5–14.5)
WBC: 10.3 10*3/uL (ref 3.8–10.6)

## 2015-12-11 MED ORDER — POTASSIUM CHLORIDE 20 MEQ/15ML (10%) PO SOLN
40.0000 meq | Freq: Once | ORAL | Status: AC
  Administered 2015-12-11: 40 meq via ORAL
  Filled 2015-12-11: qty 30

## 2015-12-11 MED ORDER — NIFEDIPINE 10 MG PO CAPS
20.0000 mg | ORAL_CAPSULE | Freq: Three times a day (TID) | ORAL | Status: DC
  Administered 2015-12-11: 20 mg via ORAL
  Filled 2015-12-11: qty 2

## 2015-12-11 MED ORDER — NIFEDIPINE ER OSMOTIC RELEASE 30 MG PO TB24
30.0000 mg | ORAL_TABLET | Freq: Every day | ORAL | Status: DC
  Administered 2015-12-13: 30 mg via ORAL
  Filled 2015-12-11 (×3): qty 1

## 2015-12-11 MED ORDER — PIPERACILLIN-TAZOBACTAM 3.375 G IVPB
3.3750 g | Freq: Three times a day (TID) | INTRAVENOUS | Status: DC
  Administered 2015-12-11 – 2015-12-15 (×13): 3.375 g via INTRAVENOUS
  Filled 2015-12-11 (×14): qty 50

## 2015-12-11 MED ORDER — ENSURE ENLIVE PO LIQD
237.0000 mL | Freq: Three times a day (TID) | ORAL | Status: DC
  Administered 2015-12-12 – 2015-12-13 (×4): 237 mL via ORAL

## 2015-12-11 NOTE — Progress Notes (Signed)
Dr.Fowler here to see patient- discussing plans for surgery tomorrow with family.

## 2015-12-11 NOTE — Progress Notes (Signed)
Initial Nutrition Assessment   INTERVENTION:   Meals and Snacks: Cater to patient preferences; SLP following Medical Food Supplement Therapy: will recommend Ensure Enlive po TID, each supplement provides 350 kcal and 20 grams of protein and Magic Cup BID Coordination of Care: will recommend collecting accurate weight  Coordination of Care: will continue to assess for malnutrition using guidelines of AND/ASPEN    NUTRITION DIAGNOSIS:   Increased nutrient needs related to wound healing as evidenced by estimated needs.  GOAL:   Patient will meet greater than or equal to 90% of their needs  MONITOR:    (Energy Intake, Skin, Anthropometrics, Electrolyte and renal Profile, Digestive System)  REASON FOR ASSESSMENT:   Malnutrition Screening Tool, Consult Assessment of nutrition requirement/status  ASSESSMENT:   Pt admitted with infected b/l heel ulcers, plan for debridement 12/12/2015. RD notes pt with previous stroke with severe flexion contractures subsequently.  Past Medical History  Diagnosis Date  . CVA (cerebral infarction)   . Hypertension     Diet Order:  DIET - DYS 1 Room service appropriate?: No; Fluid consistency:: Thin Diet NPO time specified    Current Nutrition: Pt eating bites of lunch with help of wife at bedside, only bites.  Pt prefers foods room temperatures.  Food/Nutrition-Related History: Pt family reports pt has to eat very slowly now and d/t prolonged eating times, pt does not eat very much on a regular basis. Pt family reports pt used to have a very good appetite before his stroke. Pt family reports giving pt Ensure when he does not eat enough and usually has 1-2 per day.    Scheduled Medications:  . clopidogrel  75 mg Oral Daily  . enoxaparin (LOVENOX) injection  40 mg Subcutaneous Q24H  . feeding supplement (ENSURE ENLIVE)  237 mL Oral TID BM  . NIFEdipine  30 mg Oral Daily  . piperacillin-tazobactam (ZOSYN)  IV  3.375 g Intravenous 3 times per day   . vancomycin  750 mg Intravenous Q18H    Continuous Medications:  . sodium chloride 100 mL/hr at 12/11/15 0931     Electrolyte/Renal Profile and Glucose Profile:   Recent Labs Lab 12/09/15 1146 12/10/15 1214 12/11/15 0521  NA 134* 137 141  K 3.9 3.6 3.3*  CL 100* 102 109  CO2 BUN 21* 20 19  CREATININE 1.06 1.37* 1.11  CALCIUM 9.9 9.6 8.9  GLUCOSE 125* 165* 105*   Protein Profile: No results for input(s): ALBUMIN in the last 168 hours.  Gastrointestinal Profile: Last BM:  12/11/2015   Nutrition-Focused Physical Exam Findings:  Unable to complete Nutrition-Focused physical exam at this time.    Weight Change: Pt reports a lot of weight loss. Family thinks pt was weighing at one time 240lbs but then reported pt was 70kg (which is 154lbs) as well? No weight trend per [facility] weight encounters.   Skin:   (Unstageable heel ulcer)  Height:   Ht Readings from Last 1 Encounters:  12/09/15 5' (1.524 m)    Weight:   Wt Readings from Last 1 Encounters:  12/11/15 148 lb 14.4 oz (67.541 kg)    BMI:  Body mass index is 29.08 kg/(m^2).  Estimated Nutritional Needs:   Kcal:  BEE: 1252kcals, TEE: (IF 1.2-1.4)(AF 1.2) 1803-2104kcals  Protein:  80-100g protein (1.2-1.5g/kg)  Fluid:  1675-2063mL of fluid (25-26mL/kg)  EDUCATION NEEDS:   No education needs identified at this time   HIGH Care Level  Leda Quail, RD, LDN Pager [phone] Weekend/On-Call Pager (  [phone]  

## 2015-12-11 NOTE — Care Management Note (Addendum)
Case Management Note  Patient Details  Name: Mads Borgmeyer MRN: [id number] Date of Birth: 1941-03-20  Subjective/Objective:                  Met with patient and his wife and wife answered all assessment questions asked. She states she "understands [place] well" and agreed to continue in [place]. She states she cares for her husband in the home. They have applied for a Visa but patient currently does not have a PCP or health insurance. He has been non-ambulatory since November of 2016 per wife. He has a hospital bed, wheelchair and is incontinent with urine/feces. Wife and daughter are providing total care for patient.   Action/Plan: RNCM to follow for Rx assistance. He will need a PCP before home health can provide care however, it will be self pay. I have sent referral to [facility] in the mean time. Application delivered to wife.    Expected Discharge Date:                  Expected Discharge Plan:     In-House Referral:     Discharge planning Services  CM Consult  Post Acute Care Choice:  Home Health Choice offered to:  Spouse, Patient  DME Arranged:    DME Agency:     HH Arranged:    [place] Agency:     Status of Service:  In process, will continue to follow  Medicare Important Message Given:    Date Medicare IM Given:    Medicare IM give by:    Date Additional Medicare IM Given:    Additional Medicare Important Message give by:     If discussed at [place] of Stay Meetings, dates discussed:    Additional Comments: 12/11/15 at 1:30PM: [facility] will not be able to assist this patient with discharge needs per [facility]. Wound management would be better taken care of by [facility]. Referral called to [facility] center [phone]. Again, patient needs a PCP before home health can manage in the home. Appointment made for: 12/19/15 at 0900 AM.  I have asked patient's wife to have her daughter call this RNCM to discuss home health and PCP-  she agrees.   Marshell Garfinkel, RN 12/11/2015, 10:54 AM

## 2015-12-11 NOTE — Progress Notes (Signed)
ANTIBIOTIC CONSULT NOTE - FOLLOW UP   Pharmacy Consult for piperacillin/tazobactam and vancomycin Indication: Wound infection  No Known Allergies  Patient Measurements: Height: 5' (152.4 cm) Weight: 160 lb (72.576 kg) IBW/kg (Calculated) : 50 Adjusted Body Weight: 59 kg  Vital Signs: Temp: 98.5 F (36.9 C) (02/02 0858) Temp Source: Oral (02/02 0858) BP: 176/80 mmHg (02/02 0858) Pulse Rate: 101 (02/02 0858) Intake/Output from previous day:   Intake/Output from this shift: Total I/O In: 1361 [I.V.:1361] Out: -   Labs:  Recent Labs  12/09/15 1146 12/10/15 1214 12/11/15 0521  WBC 14.0* 11.3* 10.3  HGB 13.9 11.7* 10.8*  PLT 545* 515* 439  CREATININE 1.06 1.37* 1.11   Estimated Creatinine Clearance: 48.7 mL/min (by C-G formula based on Cr of 1.11).  Recent Labs  12/10/15 2051  [facility] 17     Microbiology: Recent Results (from the past 720 hour(s))  Blood culture (routine x 2)     Status: None (Preliminary result)   Collection Time: 12/09/15  7:33 PM  Result Value Ref Range Status   Specimen Description BLOOD RIGHT ARM  Final   Special Requests BOTTLES DRAWN AEROBIC AND ANAEROBIC 3CC  Final   Culture NO GROWTH 2 DAYS  Final   Report Status PENDING  Incomplete  Blood culture (routine x 2)     Status: None (Preliminary result)   Collection Time: 12/09/15  7:33 PM  Result Value Ref Range Status   Specimen Description BLOOD LEFT HAND  Final   Special Requests BOTTLES DRAWN AEROBIC AND ANAEROBIC 2CC  Final   Culture NO GROWTH 2 DAYS  Final   Report Status PENDING  Incomplete  Wound culture     Status: None (Preliminary result)   Collection Time: 12/09/15 11:53 PM  Result Value Ref Range Status   Specimen Description FOOT  Final   Special Requests NONE  Final   Gram Stain RARE WBC SEEN FEW GRAM NEGATIVE RODS   Final   Culture NO GROWTH < 12 HOURS  Final   Report Status PENDING  Incomplete    Medical History: Past Medical History  Diagnosis Date  .  CVA (cerebral infarction)   . Hypertension    Assessment: Pharmacy consulted to dose vancomycin and piperacillin/tazobactam for a wound infection in this 75 year old male.   Adjusted body weight: 59 kg CrCl = 51 mL/min  Kinetics: Ke: 0.047 Half-life: 14.7 hours Vd: 41 L  Cmin (estimated) 15.2 mcg/mL   2/1: Serum creatinine now 1.38 mg/dl from 1.0 2/2 SCr 1.61 now, down from 1.38  Goal of Therapy:  Vancomycin trough level 15-20 mcg/ml  Plan:  Measure antibiotic drug levels at steady state Follow up culture results   Vancomycin: Random level 2/1 was 17.  Continue Vancomycin 750 mg IV q18 hours. Follow Up trough 2/3.  Zosyn: Zosyn adjusted to q12hrs yesterday due to changes in renal function. Today, Scr better. Readjusted zosyn to 3.375gm EIV q8hrs.    Continue to monitor.  Roque Cash, PharmD Pharmacy Resident 12/11/2015

## 2015-12-11 NOTE — Progress Notes (Signed)
[facility] - [place] at [facility]   PATIENT NAME: Jeremiah Scott    MR#:  [id number]  DATE OF BIRTH:  12-30-1940  SUBJECTIVE:   Wife is at bedside. She reports that the contractures that the patient has started in November due to knee pain. She reports that after his stroke he was actually ambulating and speaking. Patient is able to talk but is very limited.  REVIEW OF SYSTEMS:    Review of Systems  Unable to perform ROS   Tolerating Diet:pureed      DRUG ALLERGIES:  No Known Allergies  VITALS:  Blood pressure 176/80, pulse 101, temperature 98.5 F (36.9 C), temperature source Oral, resp. rate 18, height 5' (1.524 m), weight 72.576 kg (160 lb), SpO2 99 %.  PHYSICAL EXAMINATION:   Physical Exam  Constitutional: He is well-developed, well-nourished, and in no distress. No distress.  HENT:  Head: Normocephalic.  Eyes: No scleral icterus.  Neck: No JVD present. No tracheal deviation present.  Cardiovascular: Normal rate, regular rhythm and normal heart sounds.  Exam reveals no gallop and no friction rub.   No murmur heard. Pulmonary/Chest: Effort normal and breath sounds normal. No respiratory distress. He has no wheezes. He has no rales. He exhibits no tenderness.  Abdominal: Soft. Bowel sounds are normal. He exhibits no distension and no mass. There is no tenderness. There is no rebound and no guarding.  Musculoskeletal: Normal range of motion. He exhibits no edema.  Contracture of lower extremity  Neurological: He is alert.  Skin: Skin is warm.  Bilateral heels ulcers are wrapped      LABORATORY PANEL:   CBC  Recent Labs Lab 12/11/15 0521  WBC 10.3  HGB 10.8*  HCT 32.6*  PLT 439   ------------------------------------------------------------------------------------------------------------------  Chemistries   Recent Labs Lab 12/11/15 0521  NA 141  K 3.3*  CL 109  CO2 25  GLUCOSE 105*  BUN 19  CREATININE 1.11  CALCIUM  8.9   ------------------------------------------------------------------------------------------------------------------  Cardiac Enzymes No results for input(s): TROPONINI in the last 168 hours. ------------------------------------------------------------------------------------------------------------------  RADIOLOGY:  Dg Foot 2 Views Left  12/09/2015  CLINICAL DATA:  Bilateral heel ulcers. EXAM: LEFT FOOT - 2 VIEW; RIGHT FOOT - 2 VIEW COMPARISON:  None. FINDINGS: Left foot: Mild to moderate degenerative changes most notably at the first metatarsal phalangeal joint. Extensive vascular calcifications are noted. There is a large open wound involving the heel. No obvious destructive bony changes involving the calcaneus to suggest osteomyelitis. Calcaneal spurring changes are noted. Right foot: Mild to moderate degenerative changes most notably at the first metatarsal phalangeal joint. Extensive vascular calcifications. Soft tissue defect involving the heel but no obvious destructive bony changes to suggest osteomyelitis. IMPRESSION: Bilateral heel ulcers without obvious plain film findings for underlying osteomyelitis. MRI may be helpful for further evaluation. Electronically Signed   By: Rudie Meyer M.D.   On: 12/09/2015 18:34   Dg Foot 2 Views Right  12/09/2015  CLINICAL DATA:  Bilateral heel ulcers. EXAM: LEFT FOOT - 2 VIEW; RIGHT FOOT - 2 VIEW COMPARISON:  None. FINDINGS: Left foot: Mild to moderate degenerative changes most notably at the first metatarsal phalangeal joint. Extensive vascular calcifications are noted. There is a large open wound involving the heel. No obvious destructive bony changes involving the calcaneus to suggest osteomyelitis. Calcaneal spurring changes are noted. Right foot: Mild to moderate degenerative changes most notably at the first metatarsal phalangeal joint. Extensive vascular calcifications. Soft tissue defect involving the heel but no  obvious destructive bony  changes to suggest osteomyelitis. IMPRESSION: Bilateral heel ulcers without obvious plain film findings for underlying osteomyelitis. MRI may be helpful for further evaluation. Electronically Signed   By: Rudie Meyer M.D.   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  1. Bilateral Heel ulcers: Continue Zosyn and vancomycin. Wound culture few gram-negative rods Plan for debridement on Friday. Appreciate vascular surgery consultation. Patient will not be able to tolerate angiogram as he has to keep both legs straight. He does appear the patient has severe peripheral vascular disease and would benefit from angiogram however this will not be an option for this patient.  Continue Mepitel silicone contact layer to wound bed. Cover with 4x4 gauze.   2. Essential hypertension: Blood pressure elevated and therefore I'll will increase nifedipine to 20 minutes. Every 8 hours.   3. Hx of CVA: Patient is on Plavix..  Continue pured diet  4. Urinary tract infection: Continue Zosyn.  Discussed with wife.  CODE STATUS: FULL  TOTAL TIME TAKING CARE OF THIS PATIENT: 28 minutes.     POSSIBLE D/C 3-4 days, DEPENDING ON CLINICAL CONDITION.   Braycen Burandt M.D on 12/11/2015 at 10:34 AM  Between 7am to 6pm - Pager - [phone] After 6pm go to [email or web address] - password [email or web address] [facility]  [place] [facility]  Office  [phone]  CC: Primary care physician; No PCP Per Patient  Note: This dictation was prepared with Dragon dictation along with smaller phrase technology. Any transcriptional errors that result from this process are unintentional.

## 2015-12-11 NOTE — Consult Note (Signed)
[facility] Infectious Disease     Reason for Consult:Bil Heel ulcers    Referring [id number], J Date of Admission:  12/09/2015   Active Problems:   Wound infection ([place])   Pressure ulcer   HPI: Jeremiah Scott is a 75 y.o. male with hx CVA, largely bedbound for last several months, developing LE contractures who has been developing bil LE decubitus ulcer.  He had been applying ointment and referred to wound care in [place] but the apptment was cancelled due to snow.  Seen in wound clinic and referred for admission. He has seen podiatry and vascular.   Past Medical History  Diagnosis Date  . CVA (cerebral infarction)   . Hypertension    History reviewed. No pertinent past surgical history. Social History  Substance Use Topics  . Smoking status: Never Smoker   . Smokeless tobacco: None  . Alcohol Use: No   History reviewed. No pertinent family history.  Allergies: No Known Allergies  Current antibiotics: Antibiotics Given (last 72 hours)    Date/Time Action Medication Dose Rate   12/10/15 0300 Given   vancomycin (VANCOCIN) IVPB 750 mg/150 ml premix 750 mg 150 mL/hr   12/10/15 0501 Given   piperacillin-tazobactam (ZOSYN) IVPB 3.375 g 3.375 g 12.5 mL/hr   12/10/15 1430 Given   piperacillin-tazobactam (ZOSYN) IVPB 3.375 g 3.375 g 12.5 mL/hr   12/10/15 2234 Given   vancomycin (VANCOCIN) IVPB 750 mg/150 ml premix 750 mg 150 mL/hr   12/11/15 0126 Given   piperacillin-tazobactam (ZOSYN) IVPB 3.375 g 3.375 g 12.5 mL/hr   12/11/15 1315 Given   piperacillin-tazobactam (ZOSYN) IVPB 3.375 g 3.375 g 12.5 mL/hr   12/11/15 1713 Given   vancomycin (VANCOCIN) IVPB 750 mg/150 ml premix 750 mg 150 mL/hr      MEDICATIONS: . clopidogrel  75 mg Oral Daily  . enoxaparin (LOVENOX) injection  40 mg Subcutaneous Q24H  . feeding supplement (ENSURE ENLIVE)  237 mL Oral TID BM  . NIFEdipine  30 mg Oral Daily  . piperacillin-tazobactam (ZOSYN)  IV  3.375 g Intravenous 3 times per day  .  vancomycin  750 mg Intravenous Q18H    Review of Systems - unable to provide   OBJECTIVE: Temp:  [98.1 F (36.7 C)-98.5 F (36.9 C)] 98.2 F (36.8 C) (02/02 1605) Pulse Rate:  [101-110] 110 (02/02 1605) Resp:  [18-20] 18 (02/02 1605) BP: (143-176)/(62-80) 143/70 mmHg (02/02 1605) SpO2:  [96 %-99 %] 97 % (02/02 1605) Weight:  [67.541 kg (148 lb 14.4 oz)] 67.541 kg (148 lb 14.4 oz) (02/02 1521) Physical Exam  Constitutional: frail, contracted in bed, aphasia but says a few words, family at bedside HENT: anicteric Mouth/Throat: Oropharynx is clear and dry . No oropharyngeal exudate.  Cardiovascular: Normal rate, regular rhythm and normal heart sounds.  Pulmonary/Chest: Effort normal and breath sounds normal. No respiratory distress. He has no wheezes.  Abdominal: Soft. Bowel sounds are normal. He exhibits no distension. There is no tenderness.  Lymphadenopathy: He has no cervical adenopathy.  Neurological: aphasia, EXT bil LE contractures Skin: bil heel with large necrotic ulcers Psychiatric: unable to communicate   LABS: Results for orders placed or performed during the hospital encounter of 12/09/15 (from the past 48 hour(s))  Blood culture (routine x 2)     Status: None (Preliminary result)   Collection Time: 12/09/15  7:33 PM  Result Value Ref Range   Specimen Description BLOOD RIGHT ARM    Special Requests BOTTLES DRAWN AEROBIC AND ANAEROBIC 3CC  Culture NO GROWTH 2 DAYS    Report Status PENDING   Blood culture (routine x 2)     Status: None (Preliminary result)   Collection Time: 12/09/15  7:33 PM  Result Value Ref Range   Specimen Description BLOOD LEFT HAND    Special Requests BOTTLES DRAWN AEROBIC AND ANAEROBIC 2CC    Culture NO GROWTH 2 DAYS    Report Status PENDING   Urinalysis complete, with microscopic ([place] only)     Status: Abnormal   Collection Time: 12/09/15  8:46 PM  Result Value Ref Range   Color, Urine YELLOW (A) YELLOW   APPearance CLEAR (A) CLEAR    Glucose, UA NEGATIVE NEGATIVE mg/dL   Bilirubin Urine NEGATIVE NEGATIVE   Ketones, ur NEGATIVE NEGATIVE mg/dL   Specific Gravity, Urine 1.018 1.005 - 1.030   Hgb urine dipstick NEGATIVE NEGATIVE   pH 5.0 5.0 - 8.0   Protein, ur NEGATIVE NEGATIVE mg/dL   Nitrite NEGATIVE NEGATIVE   Leukocytes, UA TRACE (A) NEGATIVE   RBC / HPF 0-5 0 - 5 RBC/hpf   WBC, UA 6-30 0 - 5 WBC/hpf   Bacteria, UA NONE SEEN NONE SEEN   Squamous Epithelial / LPF NONE SEEN NONE SEEN   Mucous PRESENT    Hyaline Casts, UA PRESENT   Wound culture     Status: None (Preliminary result)   Collection Time: 12/09/15 11:53 PM  Result Value Ref Range   Specimen Description FOOT    Special Requests NONE    Gram Stain RARE WBC SEEN FEW GRAM NEGATIVE RODS     Culture HOLDING FOR POSSIBLE PATHOGEN    Report Status PENDING   Basic metabolic panel     Status: Abnormal   Collection Time: 12/10/15 12:14 PM  Result Value Ref Range   Sodium 137 135 - 145 mmol/L   Potassium 3.6 3.5 - 5.1 mmol/L   Chloride 102 101 - 111 mmol/L   CO2 25 22 - 32 mmol/L   Glucose, Bld 165 (H) 65 - 99 mg/dL   BUN 20 6 - 20 mg/dL   Creatinine, Ser 1.37 (H) 0.61 - 1.24 mg/dL   Calcium 9.6 8.9 - 10.3 mg/dL   GFR calc non Af Amer 49 (L) >60 mL/min   GFR calc Af Amer 57 (L) >60 mL/min    Comment: (NOTE) The eGFR has been calculated using the CKD EPI equation. This calculation has not been validated in all clinical situations. eGFR's persistently <60 mL/min signify possible Chronic Kidney Disease.    Anion gap 10 5 - 15  CBC     Status: Abnormal   Collection Time: 12/10/15 12:14 PM  Result Value Ref Range   WBC 11.3 (H) 3.8 - 10.6 K/uL   RBC 4.07 (L) 4.40 - 5.90 MIL/uL   Hemoglobin 11.7 (L) 13.0 - 18.0 g/dL   HCT 35.6 (L) 40.0 - 52.0 %   MCV 87.6 80.0 - 100.0 fL   MCH 28.9 26.0 - 34.0 pg   MCHC 33.0 32.0 - 36.0 g/dL   RDW 13.3 11.5 - 14.5 %   Platelets 515 (H) 150 - 440 K/uL  Vancomycin, random     Status: None   Collection Time:  12/10/15  8:51 PM  Result Value Ref Range   Vancomycin Rm 17 ug/mL    Comment:        Random Vancomycin therapeutic range is dependent on dosage and time of specimen collection. A peak range is 20.0-40.0 ug/mL A trough range is 5.0-15.0  ug/mL          Basic metabolic panel     Status: Abnormal   Collection Time: 12/11/15  5:21 AM  Result Value Ref Range   Sodium 141 135 - 145 mmol/L   Potassium 3.3 (L) 3.5 - 5.1 mmol/L   Chloride 109 101 - 111 mmol/L   CO2 25 22 - 32 mmol/L   Glucose, Bld 105 (H) 65 - 99 mg/dL   BUN 19 6 - 20 mg/dL   Creatinine, Ser 1.11 0.61 - 1.24 mg/dL   Calcium 8.9 8.9 - 10.3 mg/dL   GFR calc non Af Amer >60 >60 mL/min   GFR calc Af Amer >60 >60 mL/min    Comment: (NOTE) The eGFR has been calculated using the CKD EPI equation. This calculation has not been validated in all clinical situations. eGFR's persistently <60 mL/min signify possible Chronic Kidney Disease.    Anion gap 7 5 - 15  CBC     Status: Abnormal   Collection Time: 12/11/15  5:21 AM  Result Value Ref Range   WBC 10.3 3.8 - 10.6 K/uL   RBC 3.71 (L) 4.40 - 5.90 MIL/uL   Hemoglobin 10.8 (L) 13.0 - 18.0 g/dL   HCT 32.6 (L) 40.0 - 52.0 %   MCV 88.0 80.0 - 100.0 fL   MCH 29.3 26.0 - 34.0 pg   MCHC 33.3 32.0 - 36.0 g/dL   RDW 13.5 11.5 - 14.5 %   Platelets 439 150 - 440 K/uL   No components found for: ESR, C REACTIVE PROTEIN MICRO: Recent Results (from the past 720 hour(s))  Blood culture (routine x 2)     Status: None (Preliminary result)   Collection Time: 12/09/15  7:33 PM  Result Value Ref Range Status   Specimen Description BLOOD RIGHT ARM  Final   Special Requests BOTTLES DRAWN AEROBIC AND ANAEROBIC 3CC  Final   Culture NO GROWTH 2 DAYS  Final   Report Status PENDING  Incomplete  Blood culture (routine x 2)     Status: None (Preliminary result)   Collection Time: 12/09/15  7:33 PM  Result Value Ref Range Status   Specimen Description BLOOD LEFT HAND  Final   Special Requests  BOTTLES DRAWN AEROBIC AND ANAEROBIC 2CC  Final   Culture NO GROWTH 2 DAYS  Final   Report Status PENDING  Incomplete  Wound culture     Status: None (Preliminary result)   Collection Time: 12/09/15 11:53 PM  Result Value Ref Range Status   Specimen Description FOOT  Final   Special Requests NONE  Final   Gram Stain RARE WBC SEEN FEW GRAM NEGATIVE RODS   Final   Culture HOLDING FOR POSSIBLE PATHOGEN  Final   Report Status PENDING  Incomplete    IMAGING: Dg Foot 2 Views Left  12/09/2015  CLINICAL DATA:  Bilateral heel ulcers. EXAM: LEFT FOOT - 2 VIEW; RIGHT FOOT - 2 VIEW COMPARISON:  None. FINDINGS: Left foot: Mild to moderate degenerative changes most notably at the first metatarsal phalangeal joint. Extensive vascular calcifications are noted. There is a large open wound involving the heel. No obvious destructive bony changes involving the calcaneus to suggest osteomyelitis. Calcaneal spurring changes are noted. Right foot: Mild to moderate degenerative changes most notably at the first metatarsal phalangeal joint. Extensive vascular calcifications. Soft tissue defect involving the heel but no obvious destructive bony changes to suggest osteomyelitis. IMPRESSION: Bilateral heel ulcers without obvious plain film findings for underlying osteomyelitis. MRI may  be helpful for further evaluation. Electronically Signed   By: Marijo Sanes M.D.   On: 12/09/2015 18:34   Dg Foot 2 Views Right  12/09/2015  CLINICAL DATA:  Bilateral heel ulcers. EXAM: LEFT FOOT - 2 VIEW; RIGHT FOOT - 2 VIEW COMPARISON:  None. FINDINGS: Left foot: Mild to moderate degenerative changes most notably at the first metatarsal phalangeal joint. Extensive vascular calcifications are noted. There is a large open wound involving the heel. No obvious destructive bony changes involving the calcaneus to suggest osteomyelitis. Calcaneal spurring changes are noted. Right foot: Mild to moderate degenerative changes most notably at the  first metatarsal phalangeal joint. Extensive vascular calcifications. Soft tissue defect involving the heel but no obvious destructive bony changes to suggest osteomyelitis. IMPRESSION: Bilateral heel ulcers without obvious plain film findings for underlying osteomyelitis. MRI may be helpful for further evaluation. Electronically Signed   By: Marijo Sanes M.D.   On: 12/09/2015 18:34    Assessment:   Jeremiah Scott is a 75 y.o. 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 Cx pending  Recommendations Agree with debridement by podiatry and attempt at revascularization if possible I discussed with his daughter and wife that this is going to be very very difficult to treat.  I mentioned BKA as possible option.  Thank you very much for allowing me to participate in the care of this patient. Please call with questions.   Cheral Marker. Ola Spurr, MD

## 2015-12-11 NOTE — Progress Notes (Signed)
Dr.Fitzerld  at the bedside-discussing plan of care of patient with daughter.

## 2015-12-11 NOTE — Progress Notes (Signed)
[facility]  Daily Progress Note   Subjective  - * No surgery date entered *  No major changes.  For debridement tomorrow with Dr. Ether Griffins Family at bedside and discussed today with wife and daughter  Objective Filed Vitals:   12/11/15 0858 12/11/15 1326 12/11/15 1521 12/11/15 1605  BP: 176/80 156/71  143/70  Pulse: 101   110  Temp: 98.5 F (36.9 C)   98.2 F (36.8 C)  TempSrc: Oral   Oral  Resp: 18   18  Height:      Weight:   67.541 kg (148 lb 14.4 oz)   SpO2: 99%   97%    Intake/Output Summary (Last 24 hours) at 12/11/15 1631 Last data filed at 12/11/15 1615  Gross per 24 hour  Intake 2159.33 ml  Output      0 ml  Net 2159.33 ml    PULM  CTAB CV  RRR VASC  Non-palpable pedal pulses with bilateral heel ulcerations.  Laboratory CBC    Component Value Date/Time   WBC 10.3 12/11/2015 0521   HGB 10.8* 12/11/2015 0521   HCT 32.6* 12/11/2015 0521   PLT 439 12/11/2015 0521    BMET    Component Value Date/Time   NA 141 12/11/2015 0521   K 3.3* 12/11/2015 0521   CL 109 12/11/2015 0521   CO2 25 12/11/2015 0521   GLUCOSE 105* 12/11/2015 0521   BUN 19 12/11/2015 0521   CREATININE 1.11 12/11/2015 0521   CALCIUM 8.9 12/11/2015 0521   GFRNONAA >60 12/11/2015 0521   GFRAA >60 12/11/2015 0521    Assessment/Planning:    PAD with bilateral heel ulcerations.  For debridement tomorrow with Dr. Ether Griffins. Unless flow is found to be great at his debridement, will likely need revascularization to get the wounds to heal.  Difficult situation with his contractures and will make angiogram very difficult.  Will require general anesthesia and will have to see when I can get this on the schedule for next week.  Likely Monday or Wednesday.  Will follow    Jeremiah Scott  12/11/2015, 4:31 PM

## 2015-12-11 NOTE — Progress Notes (Signed)
Speech Language Pathology Treatment: Dysphagia  Patient Details Name: Jeremiah Scott MRN: [id number] DOB: [birth year] Today's Date: 12/11/2015 Time: 7939-0300 SLP Time Calculation (min) (ACUTE ONLY): 40 min  Assessment / Plan / Recommendation Clinical Impression  Pt appears to present w/ adequate toleration of current diet of Puree w/ thin liquids, however, continues to exhibit confusion and discomfort requiring full assistance and support d/t the pain in his legs/feet(baseline) and d/t his confusion(baseline). Dtr stated pt is doing "very well" w/ the Puree diet consistency w/ no s/s of oral phase dysphagia which can be seen w/ declined Cognitive status/Dementia. No overt s/s of aspiration were noted during trials of thin liquids pt consumed w/ Dtr/wife during session. Educational tx session w/ dtr and wife present in room on topics of dysphagia, dysphagia diet consistency(puree), and food options/preparation. Discussed and reviewed aspiration precautions and feeding support and facilitation including checking for oral clearing and strategies to aid oral clearing. Handouts. Rec. Dys. 1 diet w/ thin liquids; aspiration precautions; feeding assistance and meds in Puree - crushed as nec./able. ST will be available for f/u for further education while admitted if [facility]. Wife and Dtr agreed. NSG updated.    HPI HPI: Pt is a 75 y.o. male with a known history ofDementia, essential hypertension, and previous CVA in 2011 who presents from wound care with bilateral heel ulcers. He has foul odor from his feet. At baseline patient is aphasic and speaks minimally but seems to understand what is said to him by his family in Native language(family status pt does understand "a little [place]"). Interpreter was not present; unsure if pt would be able to follow through w/ a language line. Dtr conversed w/ family to encourage him to take po's. Wife and Dtr stated pt eats "softened" foods at home and deny any consistent, overt  s/s of aspiration during meals(stated is usually happened when he drank fast). Pt is currently tolerating the rec'd Dys. 1 w/ thin liquids diet - Dtr stated she liked the food consistency for her father. NSG reported good toleration of po diet/meds in puree as well.       SLP Plan  All goals met     Recommendations  Diet recommendations: Dysphagia 1 (puree);Thin liquid Liquids provided via: Straw (pinch straw as nec. to control bolus amount) Medication Administration: Crushed with puree Supervision: Staff to assist with self feeding;Full supervision/cueing for compensatory strategies;Trained caregiver to feed patient Compensations: Minimize environmental distractions;Slow rate;Small sips/bites;Follow solids with liquid Postural Changes and/or Swallow Maneuvers: Seated upright 90 degrees             Oral Care Recommendations: Oral care BID;Staff/trained caregiver to provide oral care Follow up Recommendations:  (TBD) Plan: All goals met     GO               Orinda Kenner, MS, CCC-SLP  Watson,Katherine 12/11/2015, 1:21 PM

## 2015-12-11 NOTE — Progress Notes (Signed)
Daily Progress Note   Subjective  - * No surgery date entered *  F/u b/l heel ulcers.  Objective Filed Vitals:   12/11/15 0858 12/11/15 1326 12/11/15 1521 12/11/15 1605  BP: 176/80 156/71  143/70  Pulse: 101   110  Temp: 98.5 F (36.9 C)   98.2 F (36.8 C)  TempSrc: Oral   Oral  Resp: 18   18  Height:      Weight:   67.541 kg (148 lb 14.4 oz)   SpO2: 99%   97%    Physical Exam: Heels covered.    Laboratory CBC    Component Value Date/Time   WBC 10.3 12/11/2015 0521   HGB 10.8* 12/11/2015 0521   HCT 32.6* 12/11/2015 0521   PLT 439 12/11/2015 0521    BMET    Component Value Date/Time   NA 141 12/11/2015 0521   K 3.3* 12/11/2015 0521   CL 109 12/11/2015 0521   CO2 25 12/11/2015 0521   GLUCOSE 105* 12/11/2015 0521   BUN 19 12/11/2015 0521   CREATININE 1.11 12/11/2015 0521   CALCIUM 8.9 12/11/2015 0521   GFRNONAA >60 12/11/2015 0521   GFRAA >60 12/11/2015 0521    Assessment/Planning: Necrotic heel ulcers   D/w pt and family plan for debridment and wound vac placement.  They understand pt at high risk for amputation.      Gwyneth Revels A  12/11/2015, 5:36 PM

## 2015-12-12 ENCOUNTER — Encounter
Admission: EM | Disposition: A | Discharge status: Home Health | Payer: Self-pay | Source: Home / Self Care | Attending: Internal Medicine

## 2015-12-12 ENCOUNTER — Inpatient Hospital Stay: Payer: Medicaid Other | Admitting: Anesthesiology

## 2015-12-12 ENCOUNTER — Encounter: Payer: Self-pay | Admitting: Anesthesiology

## 2015-12-12 HISTORY — PX: IRRIGATION AND DEBRIDEMENT FOOT: SHX6602

## 2015-12-12 HISTORY — PX: APPLICATION OF WOUND VAC: SHX5189

## 2015-12-12 LAB — VANCOMYCIN, TROUGH: VANCOMYCIN TR: 18 ug/mL (ref 10–20)

## 2015-12-12 LAB — MRSA PCR SCREENING: MRSA by PCR: NEGATIVE

## 2015-12-12 SURGERY — IRRIGATION AND DEBRIDEMENT FOOT
Anesthesia: General | Site: Foot | Laterality: Bilateral | Wound class: Dirty or Infected

## 2015-12-12 MED ORDER — FENTANYL CITRATE (PF) 100 MCG/2ML IJ SOLN
25.0000 ug | INTRAMUSCULAR | Status: DC | PRN
  Administered 2015-12-12 (×3): 25 ug via INTRAVENOUS

## 2015-12-12 MED ORDER — ONDANSETRON HCL 4 MG/2ML IJ SOLN
INTRAMUSCULAR | Status: DC | PRN
  Administered 2015-12-12: 4 mg via INTRAVENOUS

## 2015-12-12 MED ORDER — ONDANSETRON HCL 4 MG/2ML IJ SOLN: 4.0000 mg | Freq: Once | INTRAMUSCULAR | Status: DC | PRN

## 2015-12-12 MED ORDER — LIDOCAINE HCL (CARDIAC) 20 MG/ML IV SOLN
INTRAVENOUS | Status: DC | PRN
  Administered 2015-12-12: 60 mg via INTRAVENOUS

## 2015-12-12 MED ORDER — NEOSTIGMINE METHYLSULFATE 10 MG/10ML IV SOLN
INTRAVENOUS | Status: DC | PRN
  Administered 2015-12-12: 2 mg via INTRAVENOUS

## 2015-12-12 MED ORDER — ROCURONIUM BROMIDE 100 MG/10ML IV SOLN
INTRAVENOUS | Status: DC | PRN
  Administered 2015-12-12: 30 mg via INTRAVENOUS

## 2015-12-12 MED ORDER — FAMOTIDINE 20 MG PO TABS
20.0000 mg | ORAL_TABLET | Freq: Every day | ORAL | Status: DC
  Administered 2015-12-12 – 2015-12-19 (×6): 20 mg via ORAL
  Filled 2015-12-12 (×6): qty 1

## 2015-12-12 MED ORDER — LIDOCAINE-EPINEPHRINE 1 %-1:100000 IJ SOLN
INTRAMUSCULAR | Status: DC | PRN
  Administered 2015-12-12: 10 mL

## 2015-12-12 MED ORDER — FENTANYL CITRATE (PF) 100 MCG/2ML IJ SOLN
INTRAMUSCULAR | Status: AC
  Administered 2015-12-12: 25 ug via INTRAVENOUS
  Filled 2015-12-12: qty 2

## 2015-12-12 MED ORDER — GLYCOPYRROLATE 0.2 MG/ML IJ SOLN
INTRAMUSCULAR | Status: DC | PRN
  Administered 2015-12-12: 0.4 mg via INTRAVENOUS

## 2015-12-12 MED ORDER — PROPOFOL 10 MG/ML IV BOLUS
INTRAVENOUS | Status: DC | PRN
  Administered 2015-12-12: 70 mg via INTRAVENOUS

## 2015-12-12 MED ORDER — FENTANYL CITRATE (PF) 100 MCG/2ML IJ SOLN
INTRAMUSCULAR | Status: DC | PRN
  Administered 2015-12-12 (×3): 50 ug via INTRAVENOUS

## 2015-12-12 MED ORDER — LABETALOL HCL 5 MG/ML IV SOLN
INTRAVENOUS | Status: DC | PRN
  Administered 2015-12-12: 5 mg via INTRAVENOUS

## 2015-12-12 MED ORDER — METOPROLOL TARTRATE 1 MG/ML IV SOLN
5.0000 mg | INTRAVENOUS | Status: DC | PRN
  Administered 2015-12-12 – 2015-12-18 (×3): 5 mg via INTRAVENOUS
  Filled 2015-12-12 (×5): qty 5

## 2015-12-12 MED ORDER — LABETALOL HCL 5 MG/ML IV SOLN
5.0000 mg | INTRAVENOUS | Status: DC | PRN
  Administered 2015-12-12 (×3): 5 mg via INTRAVENOUS

## 2015-12-12 SURGICAL SUPPLY — 63 items
BANDAGE ACE 4X5 VEL STRL LF (GAUZE/BANDAGES/DRESSINGS) ×2 IMPLANT
BANDAGE STRETCH 3X4.1 STRL (GAUZE/BANDAGES/DRESSINGS) ×2 IMPLANT
BLADE OSC/SAGITTAL MD 5.5X18 (BLADE) IMPLANT
BLADE OSCILLATING/SAGITTAL (BLADE)
BLADE SURG 15 STRL LF DISP TIS (BLADE) ×1 IMPLANT
BLADE SURG 15 STRL SS (BLADE) ×1
BLADE SW THK.38XMED LNG THN (BLADE) IMPLANT
BNDG COHESIVE 4X5 TAN STRL (GAUZE/BANDAGES/DRESSINGS) ×2 IMPLANT
BNDG COHESIVE 6X5 TAN STRL LF (GAUZE/BANDAGES/DRESSINGS) ×2 IMPLANT
BNDG ESMARK 4X12 TAN STRL LF (GAUZE/BANDAGES/DRESSINGS) ×2 IMPLANT
BNDG GAUZE 4.5X4.1 6PLY STRL (MISCELLANEOUS) ×2 IMPLANT
CANISTER SUCT 1200ML W/VALVE (MISCELLANEOUS) ×2 IMPLANT
CANISTER SUCT 3000ML (MISCELLANEOUS) ×2 IMPLANT
CUFF TOURN 18 STER (MISCELLANEOUS) ×2 IMPLANT
CUFF TOURN DUAL PL 12 NO SLV (MISCELLANEOUS) ×2 IMPLANT
DRAPE FLUOR MINI C-ARM 54X84 (DRAPES) IMPLANT
DRAPE XRAY CASSETTE 23X24 (DRAPES) IMPLANT
DRESSING ALLEVYN 4X4 (MISCELLANEOUS) IMPLANT
DURAPREP 26ML APPLICATOR (WOUND CARE) ×2 IMPLANT
ELECT REM PT RETURN 9FT ADLT (ELECTROSURGICAL) ×2
ELECTRODE REM PT RTRN 9FT ADLT (ELECTROSURGICAL) ×1 IMPLANT
GAUZE PACKING 1/4X5YD (GAUZE/BANDAGES/DRESSINGS) ×2 IMPLANT
GAUZE PACKING IODOFORM 1X5 (MISCELLANEOUS) ×2 IMPLANT
GAUZE PETRO XEROFOAM 1X8 (MISCELLANEOUS) ×2 IMPLANT
GAUZE SPONGE 4X4 12PLY STRL (GAUZE/BANDAGES/DRESSINGS) ×2 IMPLANT
GAUZE STRETCH 2X75IN STRL (MISCELLANEOUS) ×2 IMPLANT
GLOVE BIO SURGEON STRL SZ7.5 (GLOVE) ×2 IMPLANT
GLOVE INDICATOR 8.0 STRL GRN (GLOVE) ×2 IMPLANT
GOWN STRL REUS W/ TWL LRG LVL3 (GOWN DISPOSABLE) ×2 IMPLANT
GOWN STRL REUS W/TWL LRG LVL3 (GOWN DISPOSABLE) ×2
GOWN STRL REUS W/TWL MED LVL3 (GOWN DISPOSABLE) ×4 IMPLANT
HANDPIECE SUCTION TUBG SURGILV (MISCELLANEOUS) ×2 IMPLANT
HANDPIECE VERSAJET DEBRIDEMENT (MISCELLANEOUS) IMPLANT
IV NS 1000ML (IV SOLUTION) ×1
IV NS 1000ML BAXH (IV SOLUTION) ×1 IMPLANT
KIT DRSG VAC SLVR GRANUFM (MISCELLANEOUS) ×2 IMPLANT
KIT RM TURNOVER STRD PROC AR (KITS) ×2 IMPLANT
LABEL OR SOLS (LABEL) ×2 IMPLANT
NEEDLE FILTER BLUNT 18X 1/2SAF (NEEDLE) ×1
NEEDLE FILTER BLUNT 18X1 1/2 (NEEDLE) ×1 IMPLANT
NEEDLE HYPO 25X1 1.5 SAFETY (NEEDLE) ×2 IMPLANT
NS IRRIG 500ML POUR BTL (IV SOLUTION) ×2 IMPLANT
PACK EXTREMITY ARMC (MISCELLANEOUS) ×2 IMPLANT
PAD ABD DERMACEA PRESS 5X9 (GAUZE/BANDAGES/DRESSINGS) ×2 IMPLANT
PENCIL ELECTRO HAND CTR (MISCELLANEOUS) ×2 IMPLANT
RASP SM TEAR CROSS CUT (RASP) IMPLANT
SOL .9 NS 3000ML IRR  AL (IV SOLUTION) ×1
SOL .9 NS 3000ML IRR UROMATIC (IV SOLUTION) ×1 IMPLANT
SOL PREP PVP 2OZ (MISCELLANEOUS) ×2
SOLUTION PREP PVP 2OZ (MISCELLANEOUS) ×1 IMPLANT
STOCKINETTE IMPERVIOUS 9X36 MD (GAUZE/BANDAGES/DRESSINGS) ×2 IMPLANT
SUT ETHILON 2 0 FS 18 (SUTURE) ×4 IMPLANT
SUT ETHILON 4-0 (SUTURE) ×1
SUT ETHILON 4-0 FS2 18XMFL BLK (SUTURE) ×1
SUT VIC AB 3-0 SH 27 (SUTURE) ×1
SUT VIC AB 3-0 SH 27X BRD (SUTURE) ×1 IMPLANT
SUT VIC AB 4-0 FS2 27 (SUTURE) ×2 IMPLANT
SUTURE ETHLN 4-0 FS2 18XMF BLK (SUTURE) ×1 IMPLANT
SWAB CULTURE AMIES ANAERIB BLU (MISCELLANEOUS) IMPLANT
SYR 3ML LL SCALE MARK (SYRINGE) ×2 IMPLANT
SYRINGE 10CC LL (SYRINGE) ×4 IMPLANT
TRAY PREP VAG/GEN (MISCELLANEOUS) ×2 IMPLANT
WND VAC CANISTER 500ML (MISCELLANEOUS) ×2 IMPLANT

## 2015-12-12 NOTE — Anesthesia Preprocedure Evaluation (Addendum)
Anesthesia Evaluation  Patient identified by MRN, date of birth, ID band Patient awake    Reviewed: Allergy & Precautions, NPO status , Patient's Chart, lab work & pertinent test results, reviewed documented beta blocker date and time   Airway Mallampati: II  TM Distance: >3 FB     Dental  (+) Chipped   Pulmonary           Cardiovascular hypertension, Pt. on medications      Neuro/Psych CVA    GI/Hepatic   Endo/Other    Renal/GU Renal InsufficiencyRenal disease     Musculoskeletal   Abdominal   Peds  Hematology   Anesthesia Other Findings Takes plavix. Stroke. Dementia.  Reproductive/Obstetrics                          Anesthesia Physical Anesthesia Plan  ASA: III  Anesthesia Plan: General   Post-op Pain Management:    Induction: Intravenous  Airway Management Planned: Oral ETT  Additional Equipment:   Intra-op Plan:   Post-operative Plan:   Informed Consent: I have reviewed the patients History and Physical, chart, labs and discussed the procedure including the risks, benefits and alternatives for the proposed anesthesia with the patient or authorized representative who has indicated his/her understanding and acceptance.     Plan Discussed with: CRNA  Anesthesia Plan Comments:        Anesthesia Quick Evaluation

## 2015-12-12 NOTE — Progress Notes (Signed)
Nursing supervisor will come over to push Lopressor due to elevated blood pressure.  Will temporarily place patient on telemetry so that he can be monitored while she pushes the medication

## 2015-12-12 NOTE — Anesthesia Postprocedure Evaluation (Signed)
Anesthesia Post Note  Patient: Jeremiah Scott  Procedure(s) Performed: Procedure(s) (LRB): IRRIGATION AND DEBRIDEMENT FOOT (Bilateral) APPLICATION OF WOUND VAC (Bilateral)  Patient location during evaluation: PACU Anesthesia Type: General Level of consciousness: awake and alert Pain management: pain level controlled Vital Signs Assessment: post-procedure vital signs reviewed and stable Respiratory status: spontaneous breathing, nonlabored ventilation, respiratory function stable and patient connected to nasal cannula oxygen Cardiovascular status: blood pressure returned to baseline and stable Postop Assessment: no signs of nausea or vomiting Anesthetic complications: no    Last Vitals:  Filed Vitals:   12/12/15 1733 12/12/15 1824  BP: 183/77 166/68  Pulse: 73 88  Temp: 36.6 C   Resp: 18     Last Pain: There were no vitals filed for this visit.               Lawrencia Mauney S

## 2015-12-12 NOTE — Progress Notes (Addendum)
[facility] - [place] at [facility]   PATIENT NAME: Jeremiah Scott    MR#:  [id number]  DATE OF BIRTH:  [birth year]  SUBJECTIVE:   Patient is planned for debridement today.  REVIEW OF SYSTEMS:    Review of Systems  Unable to perform ROS: other    Tolerating Diet: Nothing by mouth     DRUG ALLERGIES:  No Known Allergies  VITALS:  Blood pressure 155/80, pulse 92, temperature 98.5 F (36.9 C), temperature source Oral, resp. rate 18, height 5' (1.524 m), weight 67.23 kg (148 lb 3.4 oz), SpO2 100 %.  PHYSICAL EXAMINATION:   Physical Exam  Constitutional: He is well-developed, well-nourished, and in no distress. No distress.  HENT:  Head: Normocephalic.  Eyes: No scleral icterus.  Neck: No JVD present. No tracheal deviation present.  Cardiovascular: Normal rate, regular rhythm and normal heart sounds.  Exam reveals no gallop and no friction rub.   No murmur heard. Pulmonary/Chest: Effort normal and breath sounds normal. No respiratory distress. He has no wheezes. He has no rales. He exhibits no tenderness.  Abdominal: Soft. Bowel sounds are normal. He exhibits no distension and no mass. There is no tenderness. There is no rebound and no guarding.  Musculoskeletal: Normal range of motion. He exhibits no edema.  Contracture of lower extremity  Neurological:  Sleeping  Skin: Skin is warm.  Bilateral heels ulcers are wrapped      LABORATORY PANEL:   CBC  Recent Labs Lab 12/11/15 0521  WBC 10.3  HGB 10.8*  HCT 32.6*  PLT 439   ------------------------------------------------------------------------------------------------------------------  Chemistries   Recent Labs Lab 12/11/15 0521  NA 141  K 3.3*  CL 109  CO2 25  GLUCOSE 105*  BUN 19  CREATININE 1.11  CALCIUM 8.9   ------------------------------------------------------------------------------------------------------------------  Cardiac Enzymes No results for input(s):  TROPONINI in the last 168 hours. ------------------------------------------------------------------------------------------------------------------  RADIOLOGY:  No results found.   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  1. Bilateral Heel ulcers: Continue Zosyn and vancomycin. Wound culture few gram-negative rods Plan for debridement today and angiogram next week.   Continue Mepitel silicone contact layer to wound bed. Cover with 4x4 gauze.   2. Essential hypertension: Continue nifedipine.   3. Hx of CVA: Patient is on Plavix..  Continue pured diet  4. Urinary tract infection: Continue Zosyn.  Discussed with wife.  CODE STATUS: FULL  TOTAL TIME TAKING CARE OF THIS PATIENT: 28 minutes.     POSSIBLE D/C 3-4 daysEPENDING ON CLINICAL CONDITION.   Starasia Sinko M.D on 12/12/2015 at 11:01 AM  Between 7am to 6pm - Pager - [phone] After 6pm go to [email or web address] - password [email or web address] [facility]  [facility] [facility]  Office  [phone]  CC: Primary care physician; No PCP Per Patient  Note: This dictation was prepared with Dragon dictation along with smaller phrase technology. Any transcriptional errors that result from this process are unintentional.

## 2015-12-12 NOTE — Progress Notes (Signed)
[facility]  Daily Progress Note   Subjective  - * No surgery date entered *  No events overnight Sleeping now.  Wife at bedside  Objective Filed Vitals:   12/11/15 2024 12/12/15 0444 12/12/15 0500 12/12/15 0803  BP: 157/67 170/76  155/80  Pulse: 116 101  92  Temp: 98.3 F (36.8 C) 97.9 F (36.6 C)  98.5 F (36.9 C)  TempSrc: Oral Oral  Oral  Resp: Height:      Weight:   67.23 kg (148 lb 3.4 oz)   SpO2: 100% 97%  100%    Intake/Output Summary (Last 24 hours) at 12/12/15 0833 Last data filed at 12/12/15 0600  Gross per 24 hour  Intake 2673.33 ml  Output      0 ml  Net 2673.33 ml    PULM  CTAB CV  RRR VASC  Heel ulcers stable, non-palpable pedal pulses  Laboratory CBC    Component Value Date/Time   WBC 10.3 12/11/2015 0521   HGB 10.8* 12/11/2015 0521   HCT 32.6* 12/11/2015 0521   PLT 439 12/11/2015 0521    BMET    Component Value Date/Time   NA 141 12/11/2015 0521   K 3.3* 12/11/2015 0521   CL 109 12/11/2015 0521   CO2 25 12/11/2015 0521   GLUCOSE 105* 12/11/2015 0521   BUN 19 12/11/2015 0521   CREATININE 1.11 12/11/2015 0521   CALCIUM 8.9 12/11/2015 0521   GFRNONAA >60 12/11/2015 0521   GFRAA >60 12/11/2015 0521    Assessment/Planning:    Bilateral heel ulcerations with infection.  For debridement today with podiatry  PAD.  Angio will be very difficult but can try with general anesthesia next week  Will be Monday or Wednesday depending on schedule availability    DEW,JASON  12/12/2015, 8:33 AM

## 2015-12-12 NOTE — Anesthesia Procedure Notes (Signed)
Procedure Name: Intubation Performed by: Malva Cogan Pre-anesthesia Checklist: Patient identified, Patient being monitored, Timeout performed, Emergency Drugs available and Suction available Patient Re-evaluated:Patient Re-evaluated prior to inductionOxygen Delivery Method: Circle system utilized Preoxygenation: Pre-oxygenation with 100% oxygen Intubation Type: IV induction Ventilation: Mask ventilation without difficulty Laryngoscope Size: 3 and McGraph Tube type: Oral Tube size: 7.0 mm Number of attempts: 2 Airway Equipment and Method: Video-laryngoscopy Placement Confirmation: ETT inserted through vocal cords under direct vision,  positive ETCO2 and breath sounds checked- equal and bilateral Secured at: 22 cm Tube secured with: Tape Dental Injury: Teeth and Oropharynx as per pre-operative assessment  Difficulty Due To: Difficult Airway- due to reduced neck mobility Future Recommendations: Recommend- induction with short-acting agent, and alternative techniques readily available

## 2015-12-12 NOTE — Progress Notes (Signed)
Patient returned from surgery today for I&D and wound vac placement.  While in PACU patient had elevated blood pressures and had to be given 3 different doses of labatelol.  Patient pressure was improved until last reading.  Paged [facility] to request orders.

## 2015-12-12 NOTE — Progress Notes (Signed)
ANTIBIOTIC CONSULT NOTE - FOLLOW UP   Pharmacy Consult for piperacillin/tazobactam and vancomycin Indication: Wound infection  No Known Allergies  Patient Measurements: Height: 5' (152.4 cm) Weight: 148 lb (67.132 kg) IBW/kg (Calculated) : 50 Adjusted Body Weight: 59 kg  Vital Signs: Temp: 97.1 F (36.2 C) (02/03 1211) Temp Source: Tympanic (02/03 1211) BP: 158/90 mmHg (02/03 1211) Pulse Rate: 88 (02/03 1211) Intake/Output from previous day: 02/02 0701 - 02/03 0700 In: 4034.3 [P.O.:50; I.V.:3534.3; IV Piggyback:450] Out: -  Intake/Output from this shift:    Labs:  Recent Labs  12/10/15 1214 12/11/15 0521  WBC 11.3* 10.3  HGB 11.7* 10.8*  PLT 515* 439  CREATININE 1.37* 1.11   Estimated Creatinine Clearance: 46.9 mL/min (by C-G formula based on Cr of 1.11).  Recent Labs  12/10/15 2051 12/12/15 1030  VANCOTROUGH  --  18  VANCORANDOM 17  --      Microbiology: Recent Results (from the past 720 hour(s))  Blood culture (routine x 2)     Status: None (Preliminary result)   Collection Time: 12/09/15  7:33 PM  Result Value Ref Range Status   Specimen Description BLOOD RIGHT ARM  Final   Special Requests BOTTLES DRAWN AEROBIC AND ANAEROBIC 3CC  Final   Culture NO GROWTH 3 DAYS  Final   Report Status PENDING  Incomplete  Blood culture (routine x 2)     Status: None (Preliminary result)   Collection Time: 12/09/15  7:33 PM  Result Value Ref Range Status   Specimen Description BLOOD LEFT HAND  Final   Special Requests BOTTLES DRAWN AEROBIC AND ANAEROBIC 2CC  Final   Culture NO GROWTH 3 DAYS  Final   Report Status PENDING  Incomplete  Wound culture     Status: None (Preliminary result)   Collection Time: 12/09/15 11:53 PM  Result Value Ref Range Status   Specimen Description FOOT  Final   Special Requests NONE  Final   Gram Stain RARE WBC SEEN FEW GRAM NEGATIVE RODS   Final   Culture HOLDING FOR POSSIBLE PATHOGEN  Final   Report Status PENDING  Incomplete   MRSA PCR Screening     Status: None   Collection Time: 12/12/15  9:04 AM  Result Value Ref Range Status   MRSA by PCR NEGATIVE NEGATIVE Final    Comment:        The GeneXpert MRSA Assay (FDA approved for NASAL specimens only), is one component of a comprehensive MRSA colonization surveillance program. It is not intended to diagnose MRSA infection nor to guide or monitor treatment for MRSA infections.     Medical History: Past Medical History  Diagnosis Date  . CVA (cerebral infarction)   . Hypertension    Assessment: Pharmacy consulted to dose vancomycin and piperacillin/tazobactam for a wound infection in this 75 year old male.   Adjusted body weight: 59 kg CrCl = 51 mL/min  Kinetics: Ke: 0.047 Half-life: 14.7 hours Vd: 41 L  Cmin (estimated) 15.2 mcg/mL   2/1: Serum creatinine now 1.38 mg/dl from 1.0 2/2 SCr 2.95 now, down from 1.38  Goal of Therapy:  Vancomycin trough level 15-20 mcg/ml  Plan:  Measure antibiotic drug levels at steady state Follow up culture results   Vancomycin: Trough today was therapeutic at 18. Continue with current dose and recheck trough in 3 days on 2/6 at 1030, due to history of AKI.  Zosyn: Zosyn to 3.375gm EIV q8hrs. Continue.  Pharmacy will continue to monitor.  Roque Cash, PharmD Pharmacy Resident  12/12/2015   

## 2015-12-12 NOTE — Transfer of Care (Signed)
Immediate Anesthesia Transfer of Care Note  Patient: Jeremiah Scott  Procedure(s) Performed: Procedure(s): IRRIGATION AND DEBRIDEMENT FOOT (Bilateral) APPLICATION OF WOUND VAC (Bilateral)  Patient Location: PACU  Anesthesia Type:General  Level of Consciousness: sedated  Airway & Oxygen Therapy: Patient Spontanous Breathing and Patient connected to face mask oxygen  Post-op Assessment: Report given to RN and Post -op Vital signs reviewed and stable  Post vital signs: Reviewed and stable  Last Vitals:  Filed Vitals:   12/12/15 1211 12/12/15 1405  BP: 158/90 199/102  Pulse: 88 77  Temp: 36.2 C 36.3 C  Resp: 18 14    Complications: No apparent anesthesia complications

## 2015-12-12 NOTE — Op Note (Signed)
Operative note   Surgeon:Vickie Melnik [employer] forces logistics/support/administrative officer: None    Preop diagnosis: Gangrene right and left heels with infection    Postop diagnosis: Same    Procedure: Full thickness debridement to bone right and left heels with wound VAC placement right and left heels    EBL: Minimal    Anesthesia:local and general    Hemostasis: None    Specimen: Culture right and left heels for routine wound cultures    Complications: None    Operative indications: 75 year old gentleman with bilateral heel decubitus ulcerations with Ether Griffins and infection. Discussed conservative and not surgical intervention versus surgery and since July for surgery. The risks benefits alternatives and cuff indications have been discussed and consent has been given     Procedure:  Patient was brought into the OR and placed on the operating table in thelateral position. After anesthesia was obtained the bilateral lower extremity was prepped and draped in usual sterile fashion.  Attention was directed to the posterior aspect of both the right and left heels where the necrotic as sure was noted and surgically excised with a 15 blade. Full-thickness excisional debridement with a versa jet was taken down to bone on both the right and left heels. The left heel measured 9 x 5 cm and the right heel ulceration measured 7 x 4 cm. The left heel had more signs of infectious and purulent tissue. Wound cultures of both the right and left heels were performed. After full-thickness excisional debridement wound VAC placement was performed to both the right and left heels.    Patient tolerated the procedure and anesthesia well.  Was transported from the OR to the PACU with all vital signs stable and vascular status intact. Will be readmitted to the floor.

## 2015-12-13 LAB — BASIC METABOLIC PANEL
Anion gap: 6 (ref 5–15)
BUN: 13 mg/dL (ref 6–20)
CO2: 26 mmol/L (ref 22–32)
Calcium: 8.8 mg/dL — ABNORMAL LOW (ref 8.9–10.3)
Chloride: 112 mmol/L — ABNORMAL HIGH (ref 101–111)
Creatinine, Ser: 1.15 mg/dL (ref 0.61–1.24)
GFR calc Af Amer: >60 mL/min (ref >60)
GFR calc non Af Amer: >60 mL/min (ref >60)
Glucose, Bld: 99 mg/dL (ref 65–99)
Potassium: 3.9 mmol/L (ref 3.5–5.1)
Sodium: 144 mmol/L (ref 135–145)

## 2015-12-13 LAB — CBC
HCT: 29.2 % — ABNORMAL LOW (ref 40.0–52.0)
Hemoglobin: 9.6 g/dL — ABNORMAL LOW (ref 13.0–18.0)
MCH: 29.3 pg (ref 26.0–34.0)
MCHC: 33 g/dL (ref 32.0–36.0)
MCV: 88.7 fL (ref 80.0–100.0)
Platelets: 412 10*3/uL (ref 150–440)
RBC: 3.29 MIL/uL — ABNORMAL LOW (ref 4.40–5.90)
RDW: 13.5 % (ref 11.5–14.5)
WBC: 10.3 10*3/uL (ref 3.8–10.6)

## 2015-12-13 MED ORDER — NIFEDIPINE ER OSMOTIC RELEASE 30 MG PO TB24
90.0000 mg | ORAL_TABLET | Freq: Every day | ORAL | Status: DC
  Administered 2015-12-14: 90 mg via ORAL
  Filled 2015-12-13: qty 3

## 2015-12-13 NOTE — Progress Notes (Signed)
Daily Progress Note   Subjective  - 1 Day Post-Op  F/u b/l heel ulcer debridment.  No complaints.  Resting comfortably  Objective Filed Vitals:   12/12/15 1849 12/12/15 1953 12/13/15 0409 12/13/15 0500  BP: 161/57 142/55 144/66   Pulse: 78 78 92   Temp:  97.4 F (36.3 C) 99.4 F (37.4 C)   TempSrc:  Oral Oral   Resp:  18 16   Height:      Weight:    67.223 kg (148 lb 3.2 oz)  SpO2:  100% 99%     Physical Exam: Heel ulcers covered with wound vac.  Laboratory CBC    Component Value Date/Time   WBC 10.3 12/13/2015 0329   HGB 9.6* 12/13/2015 0329   HCT 29.2* 12/13/2015 0329   PLT 412 12/13/2015 0329    BMET    Component Value Date/Time   NA 144 12/13/2015 0329   K 3.9 12/13/2015 0329   CL 112* 12/13/2015 0329   CO2 26 12/13/2015 0329   GLUCOSE 99 12/13/2015 0329   BUN 13 12/13/2015 0329   CREATININE 1.15 12/13/2015 0329   CALCIUM 8.8* 12/13/2015 0329   GFRNONAA >60 12/13/2015 0329   GFRAA >60 12/13/2015 0329    Assessment/Planning: Ulcers were necrotic to bone b/l.  intraop wound cultures obtained.  No bone removed   C/W wound vac and awaiting cultures. WBC trending down.  ID has been consulted and is following.  Once cultures established can consider placement.  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  Could not straighten legs at knee past 90 degrees even under general anesthesia.  Pt at high risk of amputation.  Gwyneth Revels A  12/13/2015, 7:29 AM

## 2015-12-13 NOTE — Progress Notes (Addendum)
[facility] - [place] at [facility]   PATIENT NAME: Jeremiah Scott    MR#:  [id number]  DATE OF BIRTH:  [birth year]  SUBJECTIVE:   Patient is postoperative day #1. He has bilateral wound VAC placement. Family is at bedside  REVIEW OF SYSTEMS:    Review of Systems  Constitutional: Negative for fever, chills and malaise/fatigue.  HENT: Negative for sore throat.   Eyes: Negative for blurred vision.  Respiratory: Negative for cough, hemoptysis, shortness of breath and wheezing.   Cardiovascular: Negative for chest pain, palpitations and leg swelling.  Gastrointestinal: Negative for nausea, vomiting, abdominal pain, diarrhea and blood in stool.  Genitourinary: Negative for dysuria.  Musculoskeletal: Negative for back pain.  Neurological: Negative for dizziness, tremors and headaches.  Endo/Heme/Allergies: Does not bruise/bleed easily.    Tolerating Diet: Yes     DRUG ALLERGIES:  No Known Allergies  VITALS:  Blood pressure 171/74, pulse 90, temperature 98.9 F (37.2 C), temperature source Oral, resp. rate 17, height 5' (1.524 m), weight 67.223 kg (148 lb 3.2 oz), SpO2 100 %.  PHYSICAL EXAMINATION:   Physical Exam  Constitutional: He is oriented to person, place, and time and well-developed, well-nourished, and in no distress. No distress.  HENT:  Head: Normocephalic.  Eyes: No scleral icterus.  Neck: No JVD present. No tracheal deviation present.  Cardiovascular: Normal rate, regular rhythm and normal heart sounds.  Exam reveals no gallop and no friction rub.   No murmur heard. Pulmonary/Chest: Effort normal and breath sounds normal. No respiratory distress. He has no wheezes. He has no rales. He exhibits no tenderness.  Abdominal: Soft. Bowel sounds are normal. He exhibits no distension and no mass. There is no tenderness. There is no rebound and no guarding.  Musculoskeletal: Normal range of motion. He exhibits no edema.  Contracture of lower  extremity  Neurological: He is alert and oriented to person, place, and time.  Skin: Skin is warm.  Bilateral wound VAC placed      LABORATORY PANEL:   CBC  Recent Labs Lab 12/13/15 0329  WBC 10.3  HGB 9.6*  HCT 29.2*  PLT 412   ------------------------------------------------------------------------------------------------------------------  Chemistries   Recent Labs Lab 12/13/15 0329  NA 144  K 3.9  CL 112*  CO2 26  GLUCOSE 99  BUN 13  CREATININE 1.15  CALCIUM 8.8*   ------------------------------------------------------------------------------------------------------------------  Cardiac Enzymes No results for input(s): TROPONINI in the last 168 hours. ------------------------------------------------------------------------------------------------------------------  RADIOLOGY:  No results found.   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  1. Bilateral Heel ulcers: Continue Zosyn and vancomycin. Wound culture few gram-negative rods light growth Proteus mirabilis.  Cultures are negative today. Appreciate ID and podiatry consultation. Follow up on final wound cultures. Probable angiogram early next week by vascular surgery May be hard to salvage limbs and it has been discussed with patient may need BKA.  Continue Mepitel silicone contact layer to wound bed. Cover with 4x4 gauze.   2. Essential hypertension: Increase nifedipine for better blood pressure control. 3. Hx of CVA: Patient is on Plavix..  Continue pured diet  4. Urinary tract infection: Continue Zosyn. There is no urine culture Discussed with wife.  5. Acute on chronic anemia: Patient has some blood loss from surgery. Continue monitor hemoglobin as there is no indication for blood transfusion at this time.  CODE STATUS: FULL  TOTAL TIME TAKING CARE OF  THIS PATIENT: 25  minutes.     POSSIBLE D/C 3-4 days DEPENDING ON CLINICAL CONDITION.   Ajani Rineer M.D on 12/13/2015 at 10:32 AM  Between 7am to 6pm - Pager - [phone] After 6pm go to [email or web address] - password [email or web address] [facility]  [facility]  Office  [phone]  CC: Primary care physician; No PCP Per Patient  Note: This dictation was prepared with Dragon dictation along with smaller phrase technology. Any transcriptional errors that result from this process are unintentional.

## 2015-12-14 LAB — CBC
HEMATOCRIT: 29.9 % — AB (ref 40.0–52.0)
Hemoglobin: 9.7 g/dL — ABNORMAL LOW (ref 13.0–18.0)
MCH: 28.7 pg (ref 26.0–34.0)
MCHC: 32.5 g/dL (ref 32.0–36.0)
MCV: 88.3 fL (ref 80.0–100.0)
Platelets: 415 10*3/uL (ref 150–440)
RBC: 3.39 MIL/uL — ABNORMAL LOW (ref 4.40–5.90)
RDW: 13.4 % (ref 11.5–14.5)
WBC: 10.1 10*3/uL (ref 3.8–10.6)

## 2015-12-14 LAB — BASIC METABOLIC PANEL
Anion gap: 9 (ref 5–15)
BUN: 16 mg/dL (ref 6–20)
CALCIUM: 8.7 mg/dL — AB (ref 8.9–10.3)
CO2: 25 mmol/L (ref 22–32)
CREATININE: 1.06 mg/dL (ref 0.61–1.24)
Chloride: 111 mmol/L (ref 101–111)
GFR calc non Af Amer: >60 mL/min (ref >60)
GLUCOSE: 122 mg/dL — AB (ref 65–99)
Potassium: 3.7 mmol/L (ref 3.5–5.1)
Sodium: 145 mmol/L (ref 135–145)

## 2015-12-14 LAB — C DIFFICILE QUICK SCREEN W PCR REFLEX
C Diff antigen: NEGATIVE
C Diff interpretation: NEGATIVE
C Diff toxin: NEGATIVE

## 2015-12-14 MED ORDER — ENSURE ENLIVE PO LIQD
237.0000 mL | Freq: Two times a day (BID) | ORAL | Status: DC
  Administered 2015-12-14 – 2015-12-19 (×7): 237 mL via ORAL

## 2015-12-14 NOTE — Plan of Care (Signed)
Problem: Pain Managment: Goal: General experience of comfort will improve Outcome: Progressing Pt with c/o pain to bil heels, prn pain med given (norco). Pt stated relief with pain med.  Problem: Physical Regulation: Goal: Will remain free from infection Outcome: Progressing Pt afebrile, cont IV abx per md order. Monitor WBC.  Problem: Skin Integrity: Goal: Risk for impaired skin integrity will decrease Outcome: Progressing Turn pt q2hours, foot boots applied to promote healing of PU.   Problem: Tissue Perfusion: Goal: Risk factors for ineffective tissue perfusion will decrease Outcome: Progressing Administer lovenox per md order.  Problem: Activity: Goal: Risk for activity intolerance will decrease Outcome: Not Progressing Pt remains on bedrest, it is a challenge to turn pt d/t pain (prn med given) and he keeps his legs contracted.   Problem: Fluid Volume: Goal: Ability to maintain a balanced intake and output will improve Outcome: Progressing Cont IVF at 133ml/hour per md order, enc PO intake.  Problem: Nutrition: Goal: Adequate nutrition will be maintained Outcome: Progressing Assist pt with feeding, family at bedside. Enc PO intake, pt likes apple sauce.

## 2015-12-14 NOTE — Progress Notes (Signed)
[facility] - [place] at [facility]   PATIENT NAME: Jeremiah Scott    MR#:  [id number]  DATE OF BIRTH:  August 23, 1941  SUBJECTIVE:   Patient is postoperative day #2.  No acute issues overnight. He does have poor appetite REVIEW OF SYSTEMS:    Review of Systems  Constitutional: Negative for fever, chills and malaise/fatigue.  HENT: Negative for sore throat.   Eyes: Negative for blurred vision.  Respiratory: Negative for cough, hemoptysis, shortness of breath and wheezing.   Cardiovascular: Negative for chest pain, palpitations and leg swelling.  Gastrointestinal: Negative for nausea, vomiting, abdominal pain, diarrhea and blood in stool.  Genitourinary: Negative for dysuria.  Musculoskeletal: Negative for back pain.  Neurological: Negative for dizziness, tremors and headaches.  Endo/Heme/Allergies: Does not bruise/bleed easily.    Tolerating Diet: poor appetite    DRUG ALLERGIES:  No Known Allergies  VITALS:  Blood pressure 165/72, pulse 104, temperature 98.2 F (36.8 C), temperature source Oral, resp. rate 18, height 5' (1.524 m), weight 67 kg (147 lb 11.3 oz), SpO2 100 %.  PHYSICAL EXAMINATION:   Physical Exam  Constitutional: He is oriented to person, place, and time and well-developed, well-nourished, and in no distress. No distress.  HENT:  Head: Normocephalic.  Eyes: No scleral icterus.  Neck: No JVD present. No tracheal deviation present.  Cardiovascular: Normal rate, regular rhythm and normal heart sounds.  Exam reveals no gallop and no friction rub.   No murmur heard. Pulmonary/Chest: Effort normal and breath sounds normal. No respiratory distress. He has no wheezes. He has no rales. He exhibits no tenderness.  Abdominal: Soft. Bowel sounds are normal. He exhibits no distension and no mass. There is no tenderness. There is no rebound and no guarding.  Musculoskeletal: Normal range of motion. He exhibits no edema.  Contracture of lower  extremity  Neurological: He is alert and oriented to person, place, and time.  Skin: Skin is warm.  Bilateral wound VAC placed      LABORATORY PANEL:   CBC  Recent Labs Lab 12/14/15 0509  WBC 10.1  HGB 9.7*  HCT 29.9*  PLT 415   ------------------------------------------------------------------------------------------------------------------  Chemistries   Recent Labs Lab 12/14/15 0509  NA 145  K 3.7  CL 111  CO2 25  GLUCOSE 122*  BUN 16  CREATININE 1.06  CALCIUM 8.7*   ------------------------------------------------------------------------------------------------------------------  Cardiac Enzymes No results for input(s): TROPONINI in the last 168 hours. ------------------------------------------------------------------------------------------------------------------  RADIOLOGY:  No results found.   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  1. Bilateral Heel ulcers: Continue Zosyn  Can d/c VANCOMYCIN as per sensitivities for Morganella and Proteus   Appreciate ID and podiatry consultation. Probable angiogram early next week by vascular surgery May be hard to salvage limbs and it has been discussed with patient may need BKA.  Continue Mepitel silicone contact layer to wound bed. Cover with 4x4 gauze.   2. Essential hypertension: blood pressure still elevated today. I increased nifedipine yesterday so will allow one more day before trying to increase this more.  I  3. Hx of CVA: Patient is on Plavix..  Continue Dysphagia 1 diet  Add ENSURE 4. Urinary tract infection: Continue Zosyn. There is no urine culture   5. Acute on chronic anemia: Patient has blood loss from surgery. No indication for blood transfusion at this time. HGb stable   CODE STATUS: FULL  TOTAL  TIME TAKING CARE OF THIS PATIENT: 26 minutes.     POSSIBLE D/C 3-4 days  DEPENDING ON CLINICAL CONDITION.   Angeliah Wisdom M.D on 12/14/2015 at 9:59 AM  Between 7am to 6pm - Pager - [phone] After 6pm go to [email or web address] - password [email or web address] [facility]  [facility]  Office  [phone]  CC: Primary care physician; No PCP Per Patient  Note: This dictation was prepared with Dragon dictation along with smaller phrase technology. Any transcriptional errors that result from this process are unintentional.

## 2015-12-14 NOTE — Progress Notes (Signed)
Pt has had several loose bowel movements today, and several last night.  MD Mody notified. Orders received to obtain a stool specimen, and to place pt Enteric Precautions to rule out C Diff. Specimen sent. Will continue to monitor.

## 2015-12-14 NOTE — Progress Notes (Signed)
Pts 165/72, HR 104. MD Mody notified. Orders received to give scheduled Nifedipine 90 mg. Will continue to monitor.

## 2015-12-14 NOTE — Progress Notes (Signed)
Pts BP 180/80, HR 108. MD Mody notified. Orders received to give PRN metoprolol, and place pt on telemetry. Will continue to monitor.

## 2015-12-15 ENCOUNTER — Ambulatory Visit: Payer: Self-pay | Admitting: Surgery

## 2015-12-15 ENCOUNTER — Inpatient Hospital Stay: Payer: Medicaid Other

## 2015-12-15 ENCOUNTER — Encounter: Payer: Self-pay | Admitting: Podiatry

## 2015-12-15 LAB — BASIC METABOLIC PANEL
Anion gap: 6 (ref 5–15)
BUN: 13 mg/dL (ref 6–20)
CHLORIDE: 110 mmol/L (ref 101–111)
CO2: 26 mmol/L (ref 22–32)
CREATININE: 1 mg/dL (ref 0.61–1.24)
Calcium: 8.8 mg/dL — ABNORMAL LOW (ref 8.9–10.3)
GFR calc non Af Amer: >60 mL/min (ref >60)
Glucose, Bld: 102 mg/dL — ABNORMAL HIGH (ref 65–99)
POTASSIUM: 3.5 mmol/L (ref 3.5–5.1)
SODIUM: 142 mmol/L (ref 135–145)

## 2015-12-15 LAB — WOUND CULTURE

## 2015-12-15 LAB — CBC
HEMATOCRIT: 32.6 % — AB (ref 40.0–52.0)
Hemoglobin: 10.7 g/dL — ABNORMAL LOW (ref 13.0–18.0)
MCH: 29 pg (ref 26.0–34.0)
MCHC: 32.9 g/dL (ref 32.0–36.0)
MCV: 88.3 fL (ref 80.0–100.0)
PLATELETS: 424 10*3/uL (ref 150–440)
RBC: 3.7 MIL/uL — AB (ref 4.40–5.90)
RDW: 13.4 % (ref 11.5–14.5)
WBC: 10.8 10*3/uL — AB (ref 3.8–10.6)

## 2015-12-15 MED ORDER — CEFTRIAXONE SODIUM 2 G IJ SOLR
2.0000 g | INTRAMUSCULAR | Status: DC
  Administered 2015-12-15 – 2015-12-19 (×5): 2 g via INTRAVENOUS
  Filled 2015-12-15 (×6): qty 2

## 2015-12-15 MED ORDER — LORAZEPAM 2 MG/ML IJ SOLN
1.0000 mg | INTRAMUSCULAR | Status: AC
  Administered 2015-12-15: 1 mg via INTRAVENOUS

## 2015-12-15 MED ORDER — LORAZEPAM 2 MG/ML IJ SOLN
INTRAMUSCULAR | Status: AC
  Filled 2015-12-15: qty 1

## 2015-12-15 MED ORDER — METOPROLOL TARTRATE 50 MG PO TABS
50.0000 mg | ORAL_TABLET | Freq: Two times a day (BID) | ORAL | Status: DC
  Administered 2015-12-15 – 2015-12-19 (×9): 50 mg via ORAL
  Filled 2015-12-15 (×9): qty 1

## 2015-12-15 MED ORDER — LORAZEPAM 2 MG/ML IJ SOLN
1.0000 mg | INTRAMUSCULAR | Status: AC
  Administered 2015-12-15: 1 mg via INTRAVENOUS
  Filled 2015-12-15 (×2): qty 1

## 2015-12-15 MED ORDER — SODIUM CHLORIDE 0.9% FLUSH
10.0000 mL | Freq: Two times a day (BID) | INTRAVENOUS | Status: DC
  Administered 2015-12-15 – 2015-12-19 (×5): 10 mL via INTRAVENOUS

## 2015-12-15 NOTE — Progress Notes (Signed)
Nutrition Follow-up   INTERVENTION:   Meals and Snacks: Cater to patient preferences, recommend clarification of diet order to previous dysphagia I diet order; SLP following Medical Food Supplement Therapy: recommend continuing Ensure Enlive TID   NUTRITION DIAGNOSIS:   Increased nutrient needs related to wound healing as evidenced by estimated needs.  GOAL:   Patient will meet greater than or equal to 90% of their needs; ongoing  MONITOR:    (Energy Intake, Skin, Anthropometrics, Electrolyte and renal Profile, Digestive System)  REASON FOR ASSESSMENT:   Malnutrition Screening Tool, Consult Assessment of nutrition requirement/status  ASSESSMENT:   Pt admitted with infected b/l heel ulcers, plan for debridement 12/12/2015.  Pt plan for angiogram today 2/6 with possible BKA per MD note. Pt currently with b/l wound vacs on heel ulcers.   Diet Order:  DIET - DYS 1 Room service appropriate?: Yes; Fluid consistency:: Thin    Current Nutrition: Pt NPO today awaiting procedure. Pt's wife reports pt was eating very well yesterday and drank his Ensures. Recorded po intake 100% of breakfast on 2/4 and 2/5. RD notes on last visit, pt eats very slowly.    Gastrointestinal Profile: Last BM: 12/14/2014   Scheduled Medications:  . cefTRIAXone (ROCEPHIN)  IV  2 g Intravenous Q24H  . clopidogrel  75 mg Oral Daily  . enoxaparin (LOVENOX) injection  40 mg Subcutaneous Q24H  . famotidine  20 mg Oral Daily  . feeding supplement (ENSURE ENLIVE)  237 mL Oral BID BM  . metoprolol tartrate  50 mg Oral BID    Continuous Medications:  . sodium chloride 100 mL/hr at 12/14/15 1225     Electrolyte/Renal Profile and Glucose Profile:   Recent Labs Lab 12/13/15 0329 12/14/15 0509 12/15/15 0542  NA 144 145 142  K 3.9 3.7 3.5  CL 112* 111 110  CO2 BUN CREATININE 1.15 1.06 1.00  CALCIUM 8.8* 8.7* 8.8*  GLUCOSE 99 122* 102*   Protein Profile: No results for  input(s): ALBUMIN in the last 168 hours.   Nutrition-Focused Physical Exam Findings: Nutrition-Focused physical exam completed. Findings are no fat depletion, no muscle depletion of upper body muscles, and no edema. RD unable to fully assess legs secondary to wounds with wound vacs and positioning of contracted legs.      Weight Trend since Admission: Filed Weights   12/13/15 0500 12/14/15 0530 12/15/15 0640  Weight: 148 lb 3.2 oz (67.223 kg) 147 lb 11.3 oz (67 kg) 154 lb (69.854 kg)     Skin:   (Unstageable heel ulcer)   BMI:  Body mass index is 30.08 kg/(m^2).  Estimated Nutritional Needs:   Kcal:  BEE: 1252kcals, TEE: (IF 1.2-1.4)(AF 1.2) 1803-2104kcals  Protein:  80-100g protein (1.2-1.5g/kg)  Fluid:  1675-2080mL of fluid (25-72mL/kg)  EDUCATION NEEDS:   No education needs identified at this time   HIGH Care Level  Leda Quail, RD, LDN Pager [phone] Weekend/On-Call Pager [phone]

## 2015-12-15 NOTE — Progress Notes (Signed)
[facility] INFECTIOUS DISEASE PROGRESS NOTE Date of Admission:  12/09/2015     ID: Jeremiah Scott is a 75 y.o. male with bil heel ulcers and osteomyelitis  Active Problems:   Wound infection (HCC)   Pressure ulcer  Subjective: S/p I and D 2/3 -  No fevers  Procedure: Full thickness debridement to bone right and left heels with wound VAC placement right and left heels  ROS  Eleven systems are reviewed and negative except per hpi  Medications:  Antibiotics Given (last 72 hours)    Date/Time Action Medication Dose Rate   12/12/15 2209 Given   piperacillin-tazobactam (ZOSYN) IVPB 3.375 g 3.375 g 12.5 mL/hr   12/13/15 0508 Given   vancomycin (VANCOCIN) IVPB 750 mg/150 ml premix 750 mg 150 mL/hr   12/13/15 0508 Given   piperacillin-tazobactam (ZOSYN) IVPB 3.375 g 3.375 g 12.5 mL/hr   12/13/15 1350 Given   piperacillin-tazobactam (ZOSYN) IVPB 3.375 g 3.375 g 12.5 mL/hr   12/13/15 2118 Given   piperacillin-tazobactam (ZOSYN) IVPB 3.375 g 3.375 g 12.5 mL/hr   12/13/15 2252 Given   vancomycin (VANCOCIN) IVPB 750 mg/150 ml premix 750 mg 150 mL/hr   12/14/15 0538 Given   piperacillin-tazobactam (ZOSYN) IVPB 3.375 g 3.375 g 12.5 mL/hr   12/14/15 1450 Given   piperacillin-tazobactam (ZOSYN) IVPB 3.375 g 3.375 g 12.5 mL/hr   12/14/15 2151 Given   piperacillin-tazobactam (ZOSYN) IVPB 3.375 g 3.375 g 12.5 mL/hr   12/15/15 0538 Given   piperacillin-tazobactam (ZOSYN) IVPB 3.375 g 3.375 g 12.5 mL/hr   12/15/15 1300 Given   piperacillin-tazobactam (ZOSYN) IVPB 3.375 g 3.375 g 12.5 mL/hr     . clopidogrel  75 mg Oral Daily  . enoxaparin (LOVENOX) injection  40 mg Subcutaneous Q24H  . famotidine  20 mg Oral Daily  . feeding supplement (ENSURE ENLIVE)  237 mL Oral BID BM  . metoprolol tartrate  50 mg Oral BID  . piperacillin-tazobactam (ZOSYN)  IV  3.375 g Intravenous 3 times per day    Objective: Vital signs in last 24 hours: Temp:  [98.3 F (36.8 C)-99 F (37.2 C)] 98.3 F (36.8  C) (02/06 0521) Pulse Rate:  [89-111] 90 (02/06 1027) Resp:  [16-20] 16 (02/06 0727) BP: (122-184)/(54-80) 136/70 mmHg (02/06 1027) SpO2:  [98 %-100 %] 98 % (02/06 0949) Weight:  [69.854 kg (154 lb)] 69.854 kg (154 lb) (02/06 0640) Constitutional: frail, contracted in bed, aphasia but says a few words, family at bedside HENT: anicteric Mouth/Throat: Oropharynx is clear and dry . No oropharyngeal exudate.  Cardiovascular: Normal rate, regular rhythm and normal heart sounds.  Pulmonary/Chest: Effort normal and breath sounds normal. No respiratory distress. He has no wheezes.  Abdominal: Soft. Bowel sounds are normal. He exhibits no distension. There is no tenderness.  Lymphadenopathy: He has no cervical adenopathy.  Neurological: aphasia, EXT bil LE contractures Skin: bil heel with wound vac  Lab Results  Recent Labs  12/14/15 0509 12/15/15 0542  WBC 10.1 10.8*  HGB 9.7* 10.7*  HCT 29.9* 32.6*  NA 145 142  K 3.7 3.5  CL 111 110  CO2 25 26  BUN 16 13  CREATININE 1.06 1.00    Microbiology: Results for orders placed or performed during the hospital encounter of 12/09/15  Blood culture (routine x 2)     Status: None (Preliminary result)   Collection Time: 12/09/15  7:33 PM  Result Value Ref Range Status   Specimen Description BLOOD RIGHT ARM  Final   Special Requests BOTTLES  DRAWN AEROBIC AND ANAEROBIC 3CC  Final   Culture NO GROWTH 4 DAYS  Final   Report Status PENDING  Incomplete  Blood culture (routine x 2)     Status: None (Preliminary result)   Collection Time: 12/09/15  7:33 PM  Result Value Ref Range Status   Specimen Description BLOOD LEFT HAND  Final   Special Requests BOTTLES DRAWN AEROBIC AND ANAEROBIC 2CC  Final   Culture NO GROWTH 4 DAYS  Final   Report Status PENDING  Incomplete  Wound culture     Status: None (Preliminary result)   Collection Time: 12/09/15 11:53 PM  Result Value Ref Range Status   Specimen Description FOOT  Final   Special Requests  NONE  Final   Gram Stain RARE WBC SEEN FEW GRAM NEGATIVE RODS   Final   Culture   Final    LIGHT GROWTH PROTEUS MIRABILIS LIGHT GROWTH MORGANELLA MORGANII HOLDING FOR ADDITIONAL POSSIBLE PATHOGENS    Report Status PENDING  Incomplete   Organism ID, Bacteria PROTEUS MIRABILIS  Final   Organism ID, Bacteria MORGANELLA MORGANII  Final      Susceptibility   Morganella morganii - MIC*    AMPICILLIN Value in next row Resistant      RESISTANT>=32    CEFAZOLIN Value in next row Resistant      RESISTANT>=64    CEFEPIME Value in next row Sensitive      SENSITIVE<=1    CEFTAZIDIME Value in next row Sensitive      SENSITIVE<=1    CEFTRIAXONE Value in next row Sensitive      SENSITIVE<=1    CIPROFLOXACIN Value in next row Resistant      RESISTANT>=4    GENTAMICIN Value in next row Sensitive      SENSITIVE<=1    IMIPENEM Value in next row Sensitive      SENSITIVE2    TRIMETH/SULFA Value in next row Sensitive      SENSITIVE<=20    AMPICILLIN/SULBACTAM Value in next row Resistant      RESISTANT>=32    PIP/TAZO Value in next row Sensitive      SENSITIVE<=4    * LIGHT GROWTH MORGANELLA MORGANII   Proteus mirabilis - MIC*    AMPICILLIN Value in next row Sensitive      SENSITIVE<=4    CEFAZOLIN Value in next row Sensitive      SENSITIVE<=4    CEFEPIME Value in next row Sensitive      SENSITIVE<=4    CEFTAZIDIME Value in next row Sensitive      SENSITIVE<=4    CEFTRIAXONE Value in next row Sensitive      SENSITIVE<=4    CIPROFLOXACIN Value in next row Sensitive      SENSITIVE<=4    GENTAMICIN Value in next row Sensitive      SENSITIVE<=4    IMIPENEM Value in next row Sensitive      SENSITIVE<=4    TRIMETH/SULFA Value in next row Sensitive      SENSITIVE<=4    AMPICILLIN/SULBACTAM Value in next row Sensitive      SENSITIVE<=4    PIP/TAZO Value in next row Sensitive      SENSITIVE<=4    * LIGHT GROWTH PROTEUS MIRABILIS  MRSA PCR Screening     Status: None   Collection Time:  12/12/15  9:04 AM  Result Value Ref Range Status   MRSA by PCR NEGATIVE NEGATIVE Final    Comment:        The GeneXpert MRSA  Assay (FDA approved for NASAL specimens only), is one component of a comprehensive MRSA colonization surveillance program. It is not intended to diagnose MRSA infection nor to guide or monitor treatment for MRSA infections.   Wound culture     Status: None (Preliminary result)   Collection Time: 12/12/15  1:29 PM  Result Value Ref Range Status   Specimen Description WOUND RIGHT HEEL  Final   Special Requests NONE  Final   Gram Stain FEW WBC SEEN FEW GRAM NEGATIVE RODS   Final   Culture   Final    LIGHT GROWTH PROTEUS MIRABILIS HOLDING FOR ADDITIONAL POSSIBLE PATHOGENS    Report Status PENDING  Incomplete   Organism ID, Bacteria PROTEUS MIRABILIS  Final      Susceptibility   Proteus mirabilis - MIC*    AMPICILLIN <=2 SENSITIVE Sensitive     CEFAZOLIN <=4 SENSITIVE Sensitive     CEFEPIME <=1 SENSITIVE Sensitive     CEFTAZIDIME <=1 SENSITIVE Sensitive     CEFTRIAXONE <=1 SENSITIVE Sensitive     CIPROFLOXACIN <=0.25 SENSITIVE Sensitive     GENTAMICIN <=1 SENSITIVE Sensitive     IMIPENEM 2 SENSITIVE Sensitive     TRIMETH/SULFA <=20 SENSITIVE Sensitive     AMPICILLIN/SULBACTAM <=2 SENSITIVE Sensitive     PIP/TAZO <=4 SENSITIVE Sensitive     * LIGHT GROWTH PROTEUS MIRABILIS  Wound culture     Status: None   Collection Time: 12/12/15  1:29 PM  Result Value Ref Range Status   Specimen Description WOUND LEFT HEEL  Final   Special Requests NONE  Final   Gram Stain   Final    FEW WBC SEEN FEW GRAM NEGATIVE RODS RARE GRAM POSITIVE COCCI    Culture MODERATE PROTEUS MIRABILIS  Final   Report Status 12/15/2015 FINAL  Final   Organism ID, Bacteria PROTEUS MIRABILIS  Final      Susceptibility   Proteus mirabilis - MIC*    AMPICILLIN <=2 SENSITIVE Sensitive     CEFAZOLIN <=4 SENSITIVE Sensitive     CEFEPIME <=1 SENSITIVE Sensitive     CEFTAZIDIME <=1  SENSITIVE Sensitive     CEFTRIAXONE <=1 SENSITIVE Sensitive     CIPROFLOXACIN <=0.25 SENSITIVE Sensitive     GENTAMICIN <=1 SENSITIVE Sensitive     IMIPENEM 2 SENSITIVE Sensitive     TRIMETH/SULFA <=20 SENSITIVE Sensitive     AMPICILLIN/SULBACTAM <=2 SENSITIVE Sensitive     PIP/TAZO <=4 SENSITIVE Sensitive     * MODERATE PROTEUS MIRABILIS  C difficile quick scan w PCR reflex     Status: None   Collection Time: 12/14/15  3:15 PM  Result Value Ref Range Status   C Diff antigen NEGATIVE NEGATIVE Final   C Diff toxin NEGATIVE NEGATIVE Final   C Diff interpretation Negative for C. difficile  Final    Studies/Results: Dg Foot 2 Views Left  12/09/2015  CLINICAL DATA:  Bilateral heel ulcers. EXAM: LEFT FOOT - 2 VIEW; RIGHT FOOT - 2 VIEW COMPARISON:  None. FINDINGS: Left foot: Mild to moderate degenerative changes most notably at the first metatarsal phalangeal joint. Extensive vascular calcifications are noted. There is a large open wound involving the heel. No obvious destructive bony changes involving the calcaneus to suggest osteomyelitis. Calcaneal spurring changes are noted. Right foot: Mild to moderate degenerative changes most notably at the first metatarsal phalangeal joint. Extensive vascular calcifications. Soft tissue defect involving the heel but no obvious destructive bony changes to suggest osteomyelitis. IMPRESSION: Bilateral heel ulcers without obvious plain  film findings for underlying osteomyelitis. MRI may be helpful for further evaluation. Electronically Signed   By: Rudie Meyer M.D.   On: 12/09/2015 18:34   Dg Foot 2 Views Right  12/09/2015  CLINICAL DATA:  Bilateral heel ulcers. EXAM: LEFT FOOT - 2 VIEW; RIGHT FOOT - 2 VIEW COMPARISON:  None. FINDINGS: Left foot: Mild to moderate degenerative changes most notably at the first metatarsal phalangeal joint. Extensive vascular calcifications are noted. There is a large open wound involving the heel. No obvious destructive bony  changes involving the calcaneus to suggest osteomyelitis. Calcaneal spurring changes are noted. Right foot: Mild to moderate degenerative changes most notably at the first metatarsal phalangeal joint. Extensive vascular calcifications. Soft tissue defect involving the heel but no obvious destructive bony changes to suggest osteomyelitis. IMPRESSION: Bilateral heel ulcers without obvious plain film findings for underlying osteomyelitis. MRI may be helpful for further evaluation. Electronically Signed   By: Rudie Meyer M.D.   On: 12/09/2015 18:34    Assessment:  Jeremiah Scott is a 75 y.o. 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 Cx pending but with proteus and morganella so far. MRSA PCR neg S/p Debreidement to bone by Podiatry  with wound vac in place.  Recommendations Change to ctx pending other results Will need picc if does not get BKA.  I would suggest he strongly consider BKA and if not done then place picc and send home with 6 weeks IV ceftraixone Jeremiah Scott   12/15/2015, 2:25 PM

## 2015-12-15 NOTE — Progress Notes (Signed)
Had hoped to get patient onto the schedule today to do angiogram with anesthesia assistance, but OR unable to give [place] staff for anesthesia today to get the procedure done.  Will remain on the schedule for Wednesday

## 2015-12-15 NOTE — Progress Notes (Signed)
[facility] - [place] at [facility]   PATIENT NAME: Jeremiah Scott    MR#:  [id number]  DATE OF BIRTH:  1941/03/17  SUBJECTIVE:   Patient is doing well this morning. His heart rate was elevated. He did eat supper last night. He voices no complaints. Wife is at bedside. REVIEW OF SYSTEMS:    Review of Systems  Constitutional: Negative for fever, chills and malaise/fatigue.  HENT: Negative for sore throat.   Eyes: Negative for blurred vision.  Respiratory: Negative for cough, hemoptysis, shortness of breath and wheezing.   Cardiovascular: Negative for chest pain, palpitations and leg swelling.  Gastrointestinal: Negative for nausea, vomiting, abdominal pain, diarrhea and blood in stool.  Genitourinary: Negative for dysuria.  Musculoskeletal: Negative for back pain.  Neurological: Negative for dizziness, tremors and headaches.  Endo/Heme/Allergies: Does not bruise/bleed easily.    Tolerating Diet: Yes    DRUG ALLERGIES:  No Known Allergies  VITALS:  Blood pressure 134/69, pulse 111, temperature 98.3 F (36.8 C), temperature source Axillary, resp. rate 16, height 5' (1.524 m), weight 69.854 kg (154 lb), SpO2 98 %.  PHYSICAL EXAMINATION:   Physical Exam  Constitutional: He is oriented to person, place, and time and well-developed, well-nourished, and in no distress. No distress.  HENT:  Head: Normocephalic.  Eyes: No scleral icterus.  Neck: No JVD present. No tracheal deviation present.  Cardiovascular: Normal rate, regular rhythm and normal heart sounds.  Exam reveals no gallop and no friction rub.   No murmur heard. Pulmonary/Chest: Effort normal and breath sounds normal. No respiratory distress. He has no wheezes. He has no rales. He exhibits no tenderness.  Abdominal: Soft. Bowel sounds are normal. He exhibits no distension and no mass. There is no tenderness. There is no rebound and no guarding.  Musculoskeletal: Normal range of motion. He  exhibits no edema.  Contracture of lower extremity  Neurological: He is alert and oriented to person, place, and time.  Skin: Skin is warm.  Bilateral wound VAC placed      LABORATORY PANEL:   CBC  Recent Labs Lab 12/15/15 0542  WBC 10.8*  HGB 10.7*  HCT 32.6*  PLT 424   ------------------------------------------------------------------------------------------------------------------  Chemistries   Recent Labs Lab 12/15/15 0542  NA 142  K 3.5  CL 110  CO2 26  GLUCOSE 102*  BUN 13  CREATININE 1.00  CALCIUM 8.8*   ------------------------------------------------------------------------------------------------------------------  Cardiac Enzymes No results for input(s): TROPONINI in the last 168 hours. ------------------------------------------------------------------------------------------------------------------  RADIOLOGY:  No results found.   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  1. Bilateral Heel ulcers: Continue Zosyn  Can d/c VANCOMYCIN as per sensitivities for Morganella and Proteus  Appreciate ID and podiatry consultation. Probable angiogram Wednesday by vascular surgery May be hard to salvage limbs and it has been discussed with patient may need BKA.  Continue Mepitel silicone contact layer to wound bed. Cover with 4x4 gauze. Patient will require PICC line and long-term IV antibiotics.  2. Essential hypertension: Patient is with tachycardia and therefore I have stopped nifedipine and started metoprolol 50 mg by mouth twice a day  3. Hx of CVA: Patient is on Plavix..  Continue Dysphagia 1 diet and ENSURE  4. Urinary tract infection: Continue Zosyn. There is no urine culture   5. Acute on chronic anemia: Patient has blood loss from surgery. No indication for blood transfusion at this time.  HGb stable   CODE STATUS: FULL  TOTAL  TIME TAKING CARE OF THIS PATIENT: 25 minutes.  Discussed with wife at bedside Discussed with Dr. Sampson Goon   POSSIBLE D/C 3-4 days DEPENDING ON CLINICAL CONDITION.   Amahia Madonia M.D on 12/15/2015 at 10:18 AM  Between 7am to 6pm - Pager - [phone] After 6pm go to [email or web address] - password [email or web address] [facility]  [place] [facility]  Office  [phone]  CC: Primary care physician; No PCP Per Patient  Note: This dictation was prepared with Dragon dictation along with smaller phrase technology. Any transcriptional errors that result from this process are unintentional.

## 2015-12-15 NOTE — Progress Notes (Signed)
ANTIBIOTIC CONSULT NOTE - FOLLOW UP   Pharmacy Consult for Piperacillin/tazobactam Day 7 Indication: Wound infection  No Known Allergies  Patient Measurements: Height: 5' (152.4 cm) Weight: 154 lb (69.854 kg) IBW/kg (Calculated) : 50 Adjusted Body Weight: 59 kg  Vital Signs: Temp: 98.3 F (36.8 C) (02/06 0521) Temp Source: Axillary (02/06 0521) BP: 122/62 mmHg (02/06 1002) Pulse Rate: 105 (02/06 1002) Intake/Output from previous day: 02/05 0701 - 02/06 0700 In: 240 [P.O.:240] Out: -  Intake/Output from this shift: Total I/O In: 2660 [I.V.:2660] Out: 0   Labs:  Recent Labs  12/13/15 0329 12/14/15 0509 12/15/15 0542  WBC 10.3 10.1 10.8*  HGB 9.6* 9.7* 10.7*  PLT 412 415 424  CREATININE 1.15 1.06 1.00   Estimated Creatinine Clearance: 53.2 mL/min (by C-G formula based on Cr of 1).   Microbiology: Recent Results (from the past 720 hour(s))  Blood culture (routine x 2)     Status: None (Preliminary result)   Collection Time: 12/09/15  7:33 PM  Result Value Ref Range Status   Specimen Description BLOOD RIGHT ARM  Final   Special Requests BOTTLES DRAWN AEROBIC AND ANAEROBIC 3CC  Final   Culture NO GROWTH 4 DAYS  Final   Report Status PENDING  Incomplete  Blood culture (routine x 2)     Status: None (Preliminary result)   Collection Time: 12/09/15  7:33 PM  Result Value Ref Range Status   Specimen Description BLOOD LEFT HAND  Final   Special Requests BOTTLES DRAWN AEROBIC AND ANAEROBIC 2CC  Final   Culture NO GROWTH 4 DAYS  Final   Report Status PENDING  Incomplete  Wound culture     Status: None (Preliminary result)   Collection Time: 12/09/15 11:53 PM  Result Value Ref Range Status   Specimen Description FOOT  Final   Special Requests NONE  Final   Gram Stain RARE WBC SEEN FEW GRAM NEGATIVE RODS   Final   Culture   Final    LIGHT GROWTH PROTEUS MIRABILIS LIGHT GROWTH MORGANELLA MORGANII HOLDING FOR ADDITIONAL POSSIBLE PATHOGENS    Report Status  PENDING  Incomplete   Organism ID, Bacteria PROTEUS MIRABILIS  Final   Organism ID, Bacteria MORGANELLA MORGANII  Final      Susceptibility   Morganella morganii - MIC*    AMPICILLIN Value in next row Resistant      RESISTANT>=32    CEFAZOLIN Value in next row Resistant      RESISTANT>=64    CEFEPIME Value in next row Sensitive      SENSITIVE<=1    CEFTAZIDIME Value in next row Sensitive      SENSITIVE<=1    CEFTRIAXONE Value in next row Sensitive      SENSITIVE<=1    CIPROFLOXACIN Value in next row Resistant      RESISTANT>=4    GENTAMICIN Value in next row Sensitive      SENSITIVE<=1    IMIPENEM Value in next row Sensitive      SENSITIVE2    TRIMETH/SULFA Value in next row Sensitive      SENSITIVE<=20    AMPICILLIN/SULBACTAM Value in next row Resistant      RESISTANT>=32    PIP/TAZO Value in next row Sensitive      SENSITIVE<=4    * LIGHT GROWTH MORGANELLA MORGANII   Proteus mirabilis - MIC*    AMPICILLIN Value in next row Sensitive      SENSITIVE<=4    CEFAZOLIN Value in next row Sensitive  SENSITIVE<=4    CEFEPIME Value in next row Sensitive      SENSITIVE<=4    CEFTAZIDIME Value in next row Sensitive      SENSITIVE<=4    CEFTRIAXONE Value in next row Sensitive      SENSITIVE<=4    CIPROFLOXACIN Value in next row Sensitive      SENSITIVE<=4    GENTAMICIN Value in next row Sensitive      SENSITIVE<=4    IMIPENEM Value in next row Sensitive      SENSITIVE<=4    TRIMETH/SULFA Value in next row Sensitive      SENSITIVE<=4    AMPICILLIN/SULBACTAM Value in next row Sensitive      SENSITIVE<=4    PIP/TAZO Value in next row Sensitive      SENSITIVE<=4    * LIGHT GROWTH PROTEUS MIRABILIS  MRSA PCR Screening     Status: None   Collection Time: 12/12/15  9:04 AM  Result Value Ref Range Status   MRSA by PCR NEGATIVE NEGATIVE Final    Comment:        The GeneXpert MRSA Assay (FDA approved for NASAL specimens only), is one component of a comprehensive MRSA  colonization surveillance program. It is not intended to diagnose MRSA infection nor to guide or monitor treatment for MRSA infections.   Wound culture     Status: None (Preliminary result)   Collection Time: 12/12/15  1:29 PM  Result Value Ref Range Status   Specimen Description WOUND RIGHT HEEL  Final   Special Requests NONE  Final   Gram Stain FEW WBC SEEN FEW GRAM NEGATIVE RODS   Final   Culture   Final    LIGHT GROWTH PROTEUS MIRABILIS HOLDING FOR ADDITIONAL POSSIBLE PATHOGENS    Report Status PENDING  Incomplete   Organism ID, Bacteria PROTEUS MIRABILIS  Final      Susceptibility   Proteus mirabilis - MIC*    AMPICILLIN <=2 SENSITIVE Sensitive     CEFAZOLIN <=4 SENSITIVE Sensitive     CEFEPIME <=1 SENSITIVE Sensitive     CEFTAZIDIME <=1 SENSITIVE Sensitive     CEFTRIAXONE <=1 SENSITIVE Sensitive     CIPROFLOXACIN <=0.25 SENSITIVE Sensitive     GENTAMICIN <=1 SENSITIVE Sensitive     IMIPENEM 2 SENSITIVE Sensitive     TRIMETH/SULFA <=20 SENSITIVE Sensitive     AMPICILLIN/SULBACTAM <=2 SENSITIVE Sensitive     PIP/TAZO <=4 SENSITIVE Sensitive     * LIGHT GROWTH PROTEUS MIRABILIS  Wound culture     Status: None   Collection Time: 12/12/15  1:29 PM  Result Value Ref Range Status   Specimen Description WOUND LEFT HEEL  Final   Special Requests NONE  Final   Gram Stain   Final    FEW WBC SEEN FEW GRAM NEGATIVE RODS RARE GRAM POSITIVE COCCI    Culture MODERATE PROTEUS MIRABILIS  Final   Report Status 12/15/2015 FINAL  Final   Organism ID, Bacteria PROTEUS MIRABILIS  Final      Susceptibility   Proteus mirabilis - MIC*    AMPICILLIN <=2 SENSITIVE Sensitive     CEFAZOLIN <=4 SENSITIVE Sensitive     CEFEPIME <=1 SENSITIVE Sensitive     CEFTAZIDIME <=1 SENSITIVE Sensitive     CEFTRIAXONE <=1 SENSITIVE Sensitive     CIPROFLOXACIN <=0.25 SENSITIVE Sensitive     GENTAMICIN <=1 SENSITIVE Sensitive     IMIPENEM 2 SENSITIVE Sensitive     TRIMETH/SULFA <=20 SENSITIVE  Sensitive     AMPICILLIN/SULBACTAM <=2  SENSITIVE Sensitive     PIP/TAZO <=4 SENSITIVE Sensitive     * MODERATE PROTEUS MIRABILIS  C difficile quick scan w PCR reflex     Status: None   Collection Time: 12/14/15  3:15 PM  Result Value Ref Range Status   C Diff antigen NEGATIVE NEGATIVE Final   C Diff toxin NEGATIVE NEGATIVE Final   C Diff interpretation Negative for C. difficile  Final    Medical History: Past Medical History  Diagnosis Date  . CVA (cerebral infarction)   . Hypertension    Assessment: Pharmacy consulted to dose vancomycin and piperacillin/tazobactam for a wound infection- bilateral necrotic heel infections- in this 75 year old male.  Vancomycin  1/31 >> 2/5 Zosyn  1/31 >>  2/3 Wound cx (Left & right heel)= Proteus Mirabilis- pan sensitive. 1/31 foot cx: Morganella morganii- R-Cipro, R-Cefazolin, R- Unasyn, ampicillin 1/31 Bcx NGTD, UA: trace Le, 6-30 WBC,,0 bac 2/5 Cdiff neg.  Plan:  Measure antibiotic drug levels at steady state Follow up culture results   Zosyn: Zosyn to 3.375gm EIV q8hrs. Continue. To have angiogram to determine if needs BKA?  PICC line for poss long-term abx. ID consulting  Pharmacy will continue to monitor.  Bari Mantis PharmD Clinical Pharmacist 12/15/2015 12:37 PM

## 2015-12-15 NOTE — Progress Notes (Signed)
Patient HR sporadic running from 160 and back down to 97. MD notified. He will be placing orders for STAT EKG

## 2015-12-15 NOTE — Progress Notes (Signed)
Patient heart rate 111, nursing supervisor called to come push IV meotprolol.

## 2015-12-15 NOTE — Progress Notes (Signed)
Md paged. Patient HR back in the teens. Md notified; unable to obtain EKG with elevated HR as requested. MD instructed RN to keep the order active in case he had another episode

## 2015-12-15 NOTE — Progress Notes (Signed)
Dr mody notified for sedation for picc placement. md orders ativan  iv stat

## 2015-12-16 DIAGNOSIS — N390 Urinary tract infection, site not specified: Secondary | ICD-10-CM

## 2015-12-16 DIAGNOSIS — L89620 Pressure ulcer of left heel, unstageable: Principal | ICD-10-CM

## 2015-12-16 DIAGNOSIS — L97409 Non-pressure chronic ulcer of unspecified heel and midfoot with unspecified severity: Secondary | ICD-10-CM | POA: Insufficient documentation

## 2015-12-16 DIAGNOSIS — E46 Unspecified protein-calorie malnutrition: Secondary | ICD-10-CM

## 2015-12-16 DIAGNOSIS — Z8673 Personal history of transient ischemic attack (TIA), and cerebral infarction without residual deficits: Secondary | ICD-10-CM

## 2015-12-16 DIAGNOSIS — R1310 Dysphagia, unspecified: Secondary | ICD-10-CM

## 2015-12-16 DIAGNOSIS — I739 Peripheral vascular disease, unspecified: Secondary | ICD-10-CM

## 2015-12-16 DIAGNOSIS — L89610 Pressure ulcer of right heel, unstageable: Secondary | ICD-10-CM

## 2015-12-16 DIAGNOSIS — F0150 Vascular dementia without behavioral disturbance: Secondary | ICD-10-CM

## 2015-12-16 DIAGNOSIS — R000 Tachycardia, unspecified: Secondary | ICD-10-CM

## 2015-12-16 DIAGNOSIS — D649 Anemia, unspecified: Secondary | ICD-10-CM

## 2015-12-16 DIAGNOSIS — Z515 Encounter for palliative care: Secondary | ICD-10-CM

## 2015-12-16 DIAGNOSIS — R197 Diarrhea, unspecified: Secondary | ICD-10-CM

## 2015-12-16 LAB — WOUND CULTURE

## 2015-12-16 LAB — CBC
HCT: 30.3 % — ABNORMAL LOW (ref 40.0–52.0)
HEMOGLOBIN: 10 g/dL — AB (ref 13.0–18.0)
MCH: 28.7 pg (ref 26.0–34.0)
MCHC: 33.1 g/dL (ref 32.0–36.0)
MCV: 86.8 fL (ref 80.0–100.0)
Platelets: 405 10*3/uL (ref 150–440)
RBC: 3.49 MIL/uL — AB (ref 4.40–5.90)
RDW: 13.7 % (ref 11.5–14.5)
WBC: 9.2 10*3/uL (ref 3.8–10.6)

## 2015-12-16 LAB — CULTURE, BLOOD (ROUTINE X 2)
CULTURE: NO GROWTH
Culture: NO GROWTH

## 2015-12-16 LAB — BASIC METABOLIC PANEL
Anion gap: 5 (ref 5–15)
BUN: 12 mg/dL (ref 6–20)
CHLORIDE: 111 mmol/L (ref 101–111)
CO2: 25 mmol/L (ref 22–32)
Calcium: 8.7 mg/dL — ABNORMAL LOW (ref 8.9–10.3)
Creatinine, Ser: 0.93 mg/dL (ref 0.61–1.24)
GFR calc Af Amer: >60 mL/min (ref >60)
GFR calc non Af Amer: >60 mL/min (ref >60)
GLUCOSE: 104 mg/dL — AB (ref 65–99)
POTASSIUM: 3.2 mmol/L — AB (ref 3.5–5.1)
Sodium: 141 mmol/L (ref 135–145)

## 2015-12-16 MED ORDER — POTASSIUM CHLORIDE 20 MEQ PO PACK
40.0000 meq | PACK | Freq: Once | ORAL | Status: AC
  Administered 2015-12-16: 40 meq via ORAL
  Filled 2015-12-16: qty 2

## 2015-12-16 MED ORDER — CHLORHEXIDINE GLUCONATE CLOTH 2 % EX PADS
6.0000 | MEDICATED_PAD | Freq: Once | CUTANEOUS | Status: AC
Start: 2015-12-16 — End: 2015-12-17
  Administered 2015-12-17: 6 via TOPICAL

## 2015-12-16 MED ORDER — SODIUM CHLORIDE 0.9 % IV SOLN
INTRAVENOUS | Status: DC
Start: 2015-12-16 — End: 2015-12-19
  Administered 2015-12-18: via INTRAVENOUS

## 2015-12-16 MED ORDER — POTASSIUM CHLORIDE 20 MEQ/15ML (10%) PO SOLN
40.0000 meq | Freq: Once | ORAL | Status: DC
  Filled 2015-12-16 (×2): qty 30

## 2015-12-16 MED ORDER — ALTEPLASE 2 MG IJ SOLR
2.0000 mg | Freq: Once | INTRAMUSCULAR | Status: DC
  Filled 2015-12-16 (×2): qty 2

## 2015-12-16 NOTE — Progress Notes (Signed)
Pt still agitated . Dr mody notified for repeat dose of ativan  iv for picc placement

## 2015-12-16 NOTE — Progress Notes (Addendum)
Wound vac dressing changes done to bilateral heels. Pt was premedicated two hydrocodone 5/325. The patient did show signs of discomfort more on the left than right. Dr Ether Griffins was made aware of discomfort. About an hour after the 1st dressing change was done the right heel was changed patient tolerated well and displayed very little signs of discomfort.

## 2015-12-16 NOTE — Progress Notes (Signed)
Patient IV started beeping. RN went to stop IV from beeping tried flushing, and could not get any blood return from the IV site.

## 2015-12-16 NOTE — Progress Notes (Signed)
[facility] CLINIC INFECTIOUS DISEASE PROGRESS NOTE Date of Admission:  12/09/2015     ID: Jeremiah Scott is a 75 y.o. male with bil heel ulcers and osteomyelitis  Active Problems:   Wound infection (HCC)   Pressure ulcer  Subjective: For angiogram tomorrow Spoke with wife Procedure: Full thickness debridement to bone right and left heels with wound VAC placement right and left heels  ROS  Eleven systems are reviewed and negative except per hpi  Medications:  Antibiotics Given (last 72 hours)    Date/Time Action Medication Dose Rate   12/13/15 2118 Given   piperacillin-tazobactam (ZOSYN) IVPB 3.375 g 3.375 g 12.5 mL/hr   12/13/15 2252 Given   vancomycin (VANCOCIN) IVPB 750 mg/150 ml premix 750 mg 150 mL/hr   12/14/15 0538 Given   piperacillin-tazobactam (ZOSYN) IVPB 3.375 g 3.375 g 12.5 mL/hr   12/14/15 1450 Given   piperacillin-tazobactam (ZOSYN) IVPB 3.375 g 3.375 g 12.5 mL/hr   12/14/15 2151 Given   piperacillin-tazobactam (ZOSYN) IVPB 3.375 g 3.375 g 12.5 mL/hr   12/15/15 0538 Given   piperacillin-tazobactam (ZOSYN) IVPB 3.375 g 3.375 g 12.5 mL/hr   12/15/15 1300 Given   piperacillin-tazobactam (ZOSYN) IVPB 3.375 g 3.375 g 12.5 mL/hr   12/15/15 1522 Given   cefTRIAXone (ROCEPHIN) 2 g in dextrose 5 % 50 mL IVPB 2 g 100 mL/hr   12/16/15 1435 Given   cefTRIAXone (ROCEPHIN) 2 g in dextrose 5 % 50 mL IVPB 2 g 100 mL/hr     . alteplase  2 mg Intracatheter Once  . cefTRIAXone (ROCEPHIN)  IV  2 g Intravenous Q24H  . enoxaparin (LOVENOX) injection  40 mg Subcutaneous Q24H  . famotidine  20 mg Oral Daily  . feeding supplement (ENSURE ENLIVE)  237 mL Oral BID BM  . metoprolol tartrate  50 mg Oral BID  . sodium chloride flush  10 mL Intravenous Q12H    Objective: Vital signs in last 24 hours: Temp:  [97.3 F (36.3 C)-99.9 F (37.7 C)] 98.2 F (36.8 C) (02/07 1122) Pulse Rate:  [70-97] 70 (02/07 1122) Resp:  [15-21] 15 (02/07 1122) BP: (143-177)/(68-92) 167/68 mmHg (02/07  1122) SpO2:  [94 %-100 %] 100 % (02/07 0715) Weight:  [70.262 kg (154 lb 14.4 oz)] 70.262 kg (154 lb 14.4 oz) (02/07 0502) Constitutional: frail, contracted in bed, aphasia but says a few words, family at bedside HENT: anicteric Mouth/Throat: Oropharynx is clear and dry . No oropharyngeal exudate.  Cardiovascular: Normal rate, regular rhythm and normal heart sounds.  Pulmonary/Chest: Effort normal and breath sounds normal. No respiratory distress. He has no wheezes.  Abdominal: Soft. Bowel sounds are normal. He exhibits no distension. There is no tenderness.  Lymphadenopathy: He has no cervical adenopathy.  Neurological: aphasia, EXT bil LE contractures Skin: bil heel with wound vac  Lab Results  Recent Labs  12/15/15 0542 12/16/15 0451  WBC 10.8* 9.2  HGB 10.7* 10.0*  HCT 32.6* 30.3*  NA 142 141  K 3.5 3.2*  CL 110 111  CO2 26 25  BUN 13 12  CREATININE 1.00 0.93    Microbiology: Results for orders placed or performed during the hospital encounter of 12/09/15  Blood culture (routine x 2)     Status: None   Collection Time: 12/09/15  7:33 PM  Result Value Ref Range Status   Specimen Description BLOOD RIGHT ARM  Final   Special Requests BOTTLES DRAWN AEROBIC AND ANAEROBIC 3CC  Final   Culture NO GROWTH 7 DAYS  Final  Report Status 12/16/2015 FINAL  Final  Blood culture (routine x 2)     Status: None   Collection Time: 12/09/15  7:33 PM  Result Value Ref Range Status   Specimen Description BLOOD LEFT HAND  Final   Special Requests BOTTLES DRAWN AEROBIC AND ANAEROBIC 2CC  Final   Culture NO GROWTH 7 DAYS  Final   Report Status 12/16/2015 FINAL  Final  Wound culture     Status: None   Collection Time: 12/09/15 11:53 PM  Result Value Ref Range Status   Specimen Description FOOT  Final   Special Requests NONE  Final   Gram Stain RARE WBC SEEN FEW GRAM NEGATIVE RODS   Final   Culture   Final    LIGHT GROWTH PROTEUS MIRABILIS LIGHT GROWTH MORGANELLA MORGANII     Report Status 12/16/2015 FINAL  Final   Organism ID, Bacteria PROTEUS MIRABILIS  Final   Organism ID, Bacteria MORGANELLA MORGANII  Final      Susceptibility   Morganella morganii - MIC*    AMPICILLIN Value in next row Resistant      RESISTANT>=32    CEFAZOLIN Value in next row Resistant      RESISTANT>=64    CEFEPIME Value in next row Sensitive      SENSITIVE<=1    CEFTAZIDIME Value in next row Sensitive      SENSITIVE<=1    CEFTRIAXONE Value in next row Sensitive      SENSITIVE<=1    CIPROFLOXACIN Value in next row Resistant      RESISTANT>=4    GENTAMICIN Value in next row Sensitive      SENSITIVE<=1    IMIPENEM Value in next row Sensitive      SENSITIVE2    TRIMETH/SULFA Value in next row Sensitive      SENSITIVE<=20    AMPICILLIN/SULBACTAM Value in next row Resistant      RESISTANT>=32    PIP/TAZO Value in next row Sensitive      SENSITIVE<=4    * LIGHT GROWTH MORGANELLA MORGANII   Proteus mirabilis - MIC*    AMPICILLIN Value in next row Sensitive      SENSITIVE<=4    CEFAZOLIN Value in next row Sensitive      SENSITIVE<=4    CEFEPIME Value in next row Sensitive      SENSITIVE<=4    CEFTAZIDIME Value in next row Sensitive      SENSITIVE<=4    CEFTRIAXONE Value in next row Sensitive      SENSITIVE<=4    CIPROFLOXACIN Value in next row Sensitive      SENSITIVE<=4    GENTAMICIN Value in next row Sensitive      SENSITIVE<=4    IMIPENEM Value in next row Sensitive      SENSITIVE<=4    TRIMETH/SULFA Value in next row Sensitive      SENSITIVE<=4    AMPICILLIN/SULBACTAM Value in next row Sensitive      SENSITIVE<=4    PIP/TAZO Value in next row Sensitive      SENSITIVE<=4    * LIGHT GROWTH PROTEUS MIRABILIS  MRSA PCR Screening     Status: None   Collection Time: 12/12/15  9:04 AM  Result Value Ref Range Status   MRSA by PCR NEGATIVE NEGATIVE Final    Comment:        The GeneXpert MRSA Assay (FDA approved for NASAL specimens only), is one component of  a comprehensive MRSA colonization surveillance program. It is not intended to diagnose MRSA  infection nor to guide or monitor treatment for MRSA infections.   Wound culture     Status: None   Collection Time: 12/12/15  1:29 PM  Result Value Ref Range Status   Specimen Description WOUND RIGHT HEEL  Final   Special Requests NONE  Final   Gram Stain FEW WBC SEEN FEW GRAM NEGATIVE RODS   Final   Culture   Final    LIGHT GROWTH PROTEUS MIRABILIS LIGHT GROWTH MORGANELLA MORGANII    Report Status 12/16/2015 FINAL  Final   Organism ID, Bacteria PROTEUS MIRABILIS  Final   Organism ID, Bacteria MORGANELLA MORGANII  Final      Susceptibility   Morganella morganii - MIC*    CEFTAZIDIME Value in next row Sensitive      SENSITIVE<=1    CEFEPIME Value in next row Sensitive      SENSITIVE<=1    IMIPENEM Value in next row Sensitive      SENSITIVE2    GENTAMICIN Value in next row Sensitive      SENSITIVE<=1    CIPROFLOXACIN Value in next row Resistant      RESISTANT>=4    TRIMETH/SULFA Value in next row Sensitive      SENSITIVE<=20    * LIGHT GROWTH MORGANELLA MORGANII   Proteus mirabilis - MIC*    AMPICILLIN Value in next row Sensitive      SENSITIVE<=20    CEFAZOLIN Value in next row Sensitive      SENSITIVE<=20    CEFEPIME Value in next row Sensitive      SENSITIVE<=20    CEFTAZIDIME Value in next row Sensitive      SENSITIVE<=20    CEFTRIAXONE Value in next row Sensitive      SENSITIVE<=20    CIPROFLOXACIN Value in next row Sensitive      SENSITIVE<=20    GENTAMICIN Value in next row Sensitive      SENSITIVE<=20    IMIPENEM Value in next row Sensitive      SENSITIVE<=20    TRIMETH/SULFA Value in next row Sensitive      SENSITIVE<=20    AMPICILLIN/SULBACTAM Value in next row Sensitive      SENSITIVE<=20    PIP/TAZO Value in next row Sensitive      SENSITIVE<=20    * LIGHT GROWTH PROTEUS MIRABILIS  Wound culture     Status: None   Collection Time: 12/12/15  1:29 PM   Result Value Ref Range Status   Specimen Description WOUND LEFT HEEL  Final   Special Requests NONE  Final   Gram Stain   Final    FEW WBC SEEN FEW GRAM NEGATIVE RODS RARE GRAM POSITIVE COCCI    Culture MODERATE PROTEUS MIRABILIS  Final   Report Status 12/15/2015 FINAL  Final   Organism ID, Bacteria PROTEUS MIRABILIS  Final      Susceptibility   Proteus mirabilis - MIC*    AMPICILLIN <=2 SENSITIVE Sensitive     CEFAZOLIN <=4 SENSITIVE Sensitive     CEFEPIME <=1 SENSITIVE Sensitive     CEFTAZIDIME <=1 SENSITIVE Sensitive     CEFTRIAXONE <=1 SENSITIVE Sensitive     CIPROFLOXACIN <=0.25 SENSITIVE Sensitive     GENTAMICIN <=1 SENSITIVE Sensitive     IMIPENEM 2 SENSITIVE Sensitive     TRIMETH/SULFA <=20 SENSITIVE Sensitive     AMPICILLIN/SULBACTAM <=2 SENSITIVE Sensitive     PIP/TAZO <=4 SENSITIVE Sensitive     * MODERATE PROTEUS MIRABILIS  C difficile quick scan w PCR reflex  Status: None   Collection Time: 12/14/15  3:15 PM  Result Value Ref Range Status   C Diff antigen NEGATIVE NEGATIVE Final   C Diff toxin NEGATIVE NEGATIVE Final   C Diff interpretation Negative for C. difficile  Final    Studies/Results: Dg Chest Port 1 View  12/15/2015  CLINICAL DATA:  PICC line placement EXAM: PORTABLE CHEST 1 VIEW COMPARISON:  None. FINDINGS: Right PICC line is in place with the tip at the cavoatrial junction. There is mild vascular congestion. Low lung volumes without confluent opacity or effusion. No acute bony abnormality. Heart is borderline in size. IMPRESSION: Right PICC line tip at the cavoatrial junction. Mild vascular congestion Electronically Signed   By: Charlett Nose M.D.   On: 12/15/2015 18:06   Dg Foot 2 Views Left  12/09/2015  CLINICAL DATA:  Bilateral heel ulcers. EXAM: LEFT FOOT - 2 VIEW; RIGHT FOOT - 2 VIEW COMPARISON:  None. FINDINGS: Left foot: Mild to moderate degenerative changes most notably at the first metatarsal phalangeal joint. Extensive vascular calcifications  are noted. There is a large open wound involving the heel. No obvious destructive bony changes involving the calcaneus to suggest osteomyelitis. Calcaneal spurring changes are noted. Right foot: Mild to moderate degenerative changes most notably at the first metatarsal phalangeal joint. Extensive vascular calcifications. Soft tissue defect involving the heel but no obvious destructive bony changes to suggest osteomyelitis. IMPRESSION: Bilateral heel ulcers without obvious plain film findings for underlying osteomyelitis. MRI may be helpful for further evaluation. Electronically Signed   By: Rudie Meyer M.D.   On: 12/09/2015 18:34   Dg Foot 2 Views Right  12/09/2015  CLINICAL DATA:  Bilateral heel ulcers. EXAM: LEFT FOOT - 2 VIEW; RIGHT FOOT - 2 VIEW COMPARISON:  None. FINDINGS: Left foot: Mild to moderate degenerative changes most notably at the first metatarsal phalangeal joint. Extensive vascular calcifications are noted. There is a large open wound involving the heel. No obvious destructive bony changes involving the calcaneus to suggest osteomyelitis. Calcaneal spurring changes are noted. Right foot: Mild to moderate degenerative changes most notably at the first metatarsal phalangeal joint. Extensive vascular calcifications. Soft tissue defect involving the heel but no obvious destructive bony changes to suggest osteomyelitis. IMPRESSION: Bilateral heel ulcers without obvious plain film findings for underlying osteomyelitis. MRI may be helpful for further evaluation. Electronically Signed   By: Rudie Meyer M.D.   On: 12/09/2015 18:34    Assessment:  Jeremiah Scott is a 75 y.o. 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 Cx pending but with proteus and morganella so far. MRSA PCR neg S/p Debrideement to bone by Podiatry  with wound vac in place.  Recommendations Would consult ortho re his contractures and knee OA - I think he will need  imaging and possible aspiration   Continue ceftriaxone He has picc and if does not get BKA will need 6 weeks IV abx I would suggest he strongly consider BKA and if not done then place picc and send home with 6 weeks IV ceftraixone If he cannot have a picc line due to pulling it out, etc. May consider oral bactrim but this would be suboptimal Holly Pring   12/16/2015, 3:07 PM

## 2015-12-16 NOTE — Progress Notes (Signed)
Pt with procedure tomorrow has lovenox ordered. Dr. Anne Hahn to place orders.

## 2015-12-16 NOTE — Care Management (Signed)
Received call from patient's daughter [name] [phone]. I have updated her on plan of care ([facility] for [facility] and [facility]). [facility] has agreed to assist patient financially for both. She is aware of new PCP appointment 2/10 that I may need to reschedule if patient does not discharge in time. Daughter states that "patient can ride in a car". [facility] forms received and this RNCM will take to Dr. Ether Griffins to complete. I am hopeful that patient can discharge on PO antibiotics- current dose Rocephin $1094. [name] is the only income in household and make less that $2500/month. Social security number processing.

## 2015-12-16 NOTE — Progress Notes (Signed)
[facility] - [place] at [facility]   PATIENT NAME: Jeremiah Scott    MR#:  [id number]  DATE OF BIRTH:  Mar 13, 1941  SUBJECTIVE:   No acute events overnight. Wife is at bedside. Patient ate his breakfast and supper last night. He voices no complaints.  REVIEW OF SYSTEMS:    Review of Systems  Constitutional: Negative for fever, chills and malaise/fatigue.  HENT: Negative for sore throat.   Eyes: Negative for blurred vision.  Respiratory: Negative for cough, hemoptysis, shortness of breath and wheezing.   Cardiovascular: Negative for chest pain, palpitations and leg swelling.  Gastrointestinal: Negative for nausea, vomiting, abdominal pain, diarrhea and blood in stool.  Genitourinary: Negative for dysuria.  Musculoskeletal: Negative for back pain.  Neurological: Negative for dizziness, tremors and headaches.  Endo/Heme/Allergies: Does not bruise/bleed easily.    Tolerating Diet: Yes    DRUG ALLERGIES:  No Known Allergies  VITALS:  Blood pressure 177/72, pulse 97, temperature 97.5 F (36.4 C), temperature source Axillary, resp. rate 18, height 5' (1.524 m), weight 70.262 kg (154 lb 14.4 oz), SpO2 100 %.  PHYSICAL EXAMINATION:   Physical Exam  Constitutional: He is oriented to person, place, and time and well-developed, well-nourished, and in no distress. No distress.  HENT:  Head: Normocephalic.  Eyes: No scleral icterus.  Neck: No JVD present. No tracheal deviation present.  Cardiovascular: Normal rate, regular rhythm and normal heart sounds.  Exam reveals no gallop and no friction rub.   No murmur heard. Pulmonary/Chest: Effort normal and breath sounds normal. No respiratory distress. He has no wheezes. He has no rales. He exhibits no tenderness.  Abdominal: Soft. Bowel sounds are normal. He exhibits no distension and no mass. There is no tenderness. There is no rebound and no guarding.  Musculoskeletal: Normal range of motion. He exhibits no  edema.  Contracture of lower extremity  Neurological: He is alert and oriented to person, place, and time.  Skin: Skin is warm.  Bilateral wound VAC placed      LABORATORY PANEL:   CBC  Recent Labs Lab 12/16/15 0451  WBC 9.2  HGB 10.0*  HCT 30.3*  PLT 405   ------------------------------------------------------------------------------------------------------------------  Chemistries   Recent Labs Lab 12/16/15 0451  NA 141  K 3.2*  CL 111  CO2 25  GLUCOSE 104*  BUN 12  CREATININE 0.93  CALCIUM 8.7*   ------------------------------------------------------------------------------------------------------------------  Cardiac Enzymes No results for input(s): TROPONINI in the last 168 hours. ------------------------------------------------------------------------------------------------------------------  RADIOLOGY:  Dg Chest Port 1 View  12/15/2015  CLINICAL DATA:  PICC line placement EXAM: PORTABLE CHEST 1 VIEW COMPARISON:  None. FINDINGS: Right PICC line is in place with the tip at the cavoatrial junction. There is mild vascular congestion. Low lung volumes without confluent opacity or effusion. No acute bony abnormality. Heart is borderline in size. IMPRESSION: Right PICC line tip at the cavoatrial junction. Mild vascular congestion Electronically Signed   By: Charlett Nose M.D.   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  1. Bilateral Heel ulcers:  Zosyn changed to Rocephin as per ID consultation. Wound cultures positive for Proteus and Morganella. Appreciate ID and podiatry consultation. Angiogram planned for Wednesday by vascular surgery May be hard to salvage limbs and it has been discussed with patient may need BKA. They are not ready for BKA. Patient has taken will need  IV antiemetics for 6 weeks.  Continue Mepitel silicone  contact layer to wound bed. Cover with 4x4 gauze. .  2. Essential hypertension:Continue metoprolol 50 mg by mouth twice a day  3. Hx of CVA: Patient is on Plavix. HOLD PLAVIX today for planned angioma cramp and possible angioplasty tomorrow. Continue Dysphagia 1 diet and ENSURE  4. Urinary tract infection: She has been treated. There is no urine culture. 5. Hypokalemia: Potassium will be repleted and rechecked in a.m.   6. Acute on chronic anemia: Patient has blood loss from surgery. No indication for blood transfusion at this time. HGb stable   CODE STATUS: FULL  TOTAL TIME TAKING CARE OF THIS PATIENT: 28 minutes.  Discussed with wife at bedside Discussed with Dr Wyn Quaker  POSSIBLE D/C tomorrow DEPENDING ON CLINICAL CONDITION.   Madisan Bice M.D on 12/16/2015 at 10:16 AM  Between 7am to 6pm - Pager - [phone] After 6pm go to [email or web address] - password [email or web address] [facility]  [facility]  Office  [phone]  CC: Primary care physician; No PCP Per Patient  Note: This dictation was prepared with Dragon dictation along with smaller phrase technology. Any transcriptional errors that result from this process are unintentional.

## 2015-12-16 NOTE — Progress Notes (Signed)
MD notified. PICC line occluded. MD acknowledged. [facility] notified.

## 2015-12-16 NOTE — Progress Notes (Signed)
Daily Progress Note   Subjective  - 4 Days Post-Op  F/u heel debridment.  Pt seems to be in pain with any motion to legs and especially to heels.  Objective Filed Vitals:   12/16/15 0007 12/16/15 0502 12/16/15 0715 12/16/15 1122  BP: 165/92 175/75 177/72 167/68  Pulse: 86 91 97 70  Temp: 99.9 F (37.7 C) 97.3 F (36.3 C) 97.5 F (36.4 C) 98.2 F (36.8 C)  TempSrc: Oral Oral Axillary Axillary  Resp: Height:      Weight:  70.262 kg (154 lb 14.4 oz)    SpO2: 94% 100% 100%     Physical Exam: Wound vac intact.  No surrounding erythema or lymphangitis.  Laboratory CBC    Component Value Date/Time   WBC 9.2 12/16/2015 0451   HGB 10.0* 12/16/2015 0451   HCT 30.3* 12/16/2015 0451   PLT 405 12/16/2015 0451    BMET    Component Value Date/Time   NA 141 12/16/2015 0451   K 3.2* 12/16/2015 0451   CL 111 12/16/2015 0451   CO2 25 12/16/2015 0451   GLUCOSE 104* 12/16/2015 0451   BUN 12 12/16/2015 0451   CREATININE 0.93 12/16/2015 0451   CALCIUM 8.7* 12/16/2015 0451   GFRNONAA >60 12/16/2015 0451   GFRAA >60 12/16/2015 0451    Assessment/Planning: Will have wound vac changed. To angio tomorrow.   Pt seems to be in a lot of pain and has severe lower leg contracture.    Have recommended consideration to BKA/AKA for pain control and quality of life as pt does not ambulate.  Will continue to follow loosely while in house.  Debridment was down to bone but no obvious destruction of bone was noted intra-op.     Gwyneth Revels A  12/16/2015, 1:08 PM

## 2015-12-16 NOTE — Consult Note (Signed)
Palliative Medicine Inpatient Consult Note   Name: Jeremiah Scott Date: 12/16/2015 MRN: [id number]  DOB: August 14, 1941  Referring Physician: Adrian Saran, MD  Palliative Care consult requested for this 75 y.o. male for goals of medical therapy in patient with dementia and bilateral heel ulcers.   HISTORY:  He has a history of a prior CVA and was sent here from wound care clinic for infected bilateral heel ulcers.  He had been getting Zosyn and Vancomycin and is now on Rocephin with wound cxs positive for Proteus and Morganella.  Infectious Disease and Podiatry have been seeing pt.  Angiogram is planned for this Wednesday to assess vascular flow (per vascular surgical consult).  Pt may end up needing BKA(s). Pt/ family reportedly 'not ready for amputation'.  IV ABX will be needed for 6 weeks.   Pt was assessed by SLP and is on a Dysphagia 1 diet plus Ensure for malnutrition. Pt ate and drank Ensure well also for last two days.  Pt is noted to eat very slowly.   Pts PICC line became occluded on 2/7.     TODAY'S DISCUSSIONS AND DECISIONS: I came in at a time when it was not good to converse.  Pt is having wound vac dressings changed and he is clinging to his wife and calling out in pain episodically.  We talked about talking again tomorrow.  I think our conversation might be more important to take place following his angio procedure tomorrow.  Daughter, Jeremiah Scott, will be here till noon tomorrow also (then she has to go to work).  I think it will be important to discuss code status and wishes --if possible --FOLLOWING tomorrow's procedure --if family will be ready to discuss.  Unable to address this today with pt calling out in pain during wound dressing changes.  It is apparent that he will need some pain management going forward.      IMPRESSION:    Vascular Dementia  Prior CVA in 2011  ---was walking and talking after this CVA Probably subacute cerebrovascular event about 3 months ago b/c that is when pt  declined: ---left with vascular dementia, leg contractures, and aphasia Infected bilateral heel pressure ulcers  ---Left heel was 3.2 by 4.5 cm and was unstageable and debrided to bone ---Right heel is 4 by 6 cm and unstageable and debrided to bon ---debrided in OR with general anesthesia (found to have difficult airway due to reduced neck mobility) on 2/3 ---wound vacs ---for angiogram --possible angioplasty or BKA Wed 2/8 ---Mepital silicone contact layer to wound bed covered with 4 X 4 gauze Severe PAD is thought to be present UTI --no urine cx Tachycardia  HTN Diarrhea with negative C Diff tests on 2/5 Acute on Chronic Anemia ---had some acute blood loss anemia from the debridements of wounds   REVIEW OF SYSTEMS:  Patient is not able to provide ROS due to dementia.  Known to be incontinent of B and B in bed.  Flexion leg contractures. Aphasia.   SPIRITUAL SUPPORT SYSTEM: Yes -family.  SOCIAL HISTORY:  reports that he has never smoked. He does not have any smokeless tobacco history on file. He reports that he does not drink alcohol or use illicit drugs. Family is originally from [place].  Married to [name].  Wife speaks English/  She has cared for husband in the home--with help from daughter in the home. No PCP or health insurance. Have applied for [employer].  Pt has hospital bed, wheelchair in the home.  Daughter is  Jeremiah Scott at [phone]. She works at [employer] and is only income provider in the home.   Pt is to have [facility] from [facility] to assist pt  With [facility] for [facility] RN and for wound vac.  Pt has a new PCP appt 2/10 --refer to Care Mgmt notes from 2/7 for more details.    LEGAL DOCUMENTS:  None  CODE STATUS: Full code  PAST MEDICAL HISTORY: Past Medical History  Diagnosis Date  . CVA (cerebral infarction)   . Hypertension     PAST SURGICAL HISTORY:  Past Surgical History  Procedure Laterality Date  . Irrigation and debridement foot Bilateral 12/12/2015     Procedure: IRRIGATION AND DEBRIDEMENT FOOT;  Surgeon: Gwyneth Revels, DPM;  Location: [facility] ORS;  Service: Podiatry;  Laterality: Bilateral;  . Application of wound vac Bilateral 12/12/2015    Procedure: APPLICATION OF WOUND VAC;  Surgeon: Gwyneth Revels, DPM;  Location: [facility] ORS;  Service: Podiatry;  Laterality: Bilateral;    ALLERGIES:  has No Known Allergies.  MEDICATIONS:  Current Facility-Administered Medications  Medication Dose Route Frequency Provider Last Rate Last Dose  . 0.9 %  sodium chloride infusion   Intravenous Continuous Adrian Saran, MD 100 mL/hr at 12/14/15 1225    . acetaminophen (TYLENOL) tablet 650 mg  650 mg Oral Q6H PRN Adrian Saran, MD   650 mg at 12/14/15 2250   Or  . acetaminophen (TYLENOL) suppository 650 mg  650 mg Rectal Q6H PRN Sital Mody, MD      . alteplase (CATHFLO ACTIVASE) injection 2 mg  2 mg Intracatheter Once Ihor Austin, MD      . alum & mag hydroxide-simeth (MAALOX/MYLANTA) 200-200-20 MG/5ML suspension 30 mL  30 mL Oral Q6H PRN Sital Mody, MD      . cefTRIAXone (ROCEPHIN) 2 g in dextrose 5 % 50 mL IVPB  2 g Intravenous Q24H Clydie Braun, MD   2 g at 12/16/15 1435  . enoxaparin (LOVENOX) injection 40 mg  40 mg Subcutaneous Q24H Adrian Saran, MD   40 mg at 12/15/15 2222  . famotidine (PEPCID) tablet 20 mg  20 mg Oral Daily Ramonita Lab, MD   20 mg at 12/16/15 0902  . feeding supplement (ENSURE ENLIVE) (ENSURE ENLIVE) liquid 237 mL  237 mL Oral BID BM Adrian Saran, MD   237 mL at 12/16/15 0923  . HYDROcodone-acetaminophen (NORCO/VICODIN) 5-325 MG per tablet 1-2 tablet  1-2 tablet Oral Q4H PRN Adrian Saran, MD   2 tablet at 12/16/15 1434  . metoprolol (LOPRESSOR) injection 5 mg  5 mg Intravenous Q4H PRN Ramonita Lab, MD   5 mg at 12/14/15 1632  . metoprolol (LOPRESSOR) tablet 50 mg  50 mg Oral BID Adrian Saran, MD   50 mg at 12/16/15 0902  . ondansetron (ZOFRAN) tablet 4 mg  4 mg Oral Q6H PRN Adrian Saran, MD       Or  . ondansetron (ZOFRAN) injection 4 mg  4 mg  Intravenous Q6H PRN Adrian Saran, MD      . senna-docusate (Senokot-S) tablet 1 tablet  1 tablet Oral QHS PRN Adrian Saran, MD      . sodium chloride flush (NS) 0.9 % injection 10 mL  10 mL Intravenous Q12H Adrian Saran, MD   10 mL at 12/15/15 2222    Vital Signs: BP 167/68 mmHg  Pulse 70  Temp(Src) 98.2 F (36.8 C) (Axillary)  Resp 15  Ht 5' (1.524 m)  Wt 70.262 kg (154 lb 14.4 oz)  BMI 30.25 kg/m2  SpO2 100% Filed Weights   12/14/15 0530 12/15/15 0640 12/16/15 0502  Weight: 67 kg (147 lb 11.3 oz) 69.854 kg (154 lb) 70.262 kg (154 lb 14.4 oz)    Estimated body mass index is 30.25 kg/(m^2) as calculated from the following:   Height as of this encounter: 5' (1.524 m).   Weight as of this encounter: 70.262 kg (154 lb 14.4 oz).  PERFORMANCE STATUS (ECOG) : 4 - Bedbound  PHYSICAL EXAM: Bilateral heel wound vacs with dressing changes underway per nursing Pt has bilateral flexion leg contractures He is calling out and moaning in pain and clinging to his wife's arms EOMI He mumbled something to wife --unintelligible Neck w/o JVD or TM Hrt rrr no m Lungs cta ant Abd --unable to examine due to his positioning while having wound vac sponges changed out. Ext Flexion contractures at hips and knees bilaterally --unable to view wounds currently.    LABS: CBC:    Component Value Date/Time   WBC 9.2 12/16/2015 0451   HGB 10.0* 12/16/2015 0451   HCT 30.3* 12/16/2015 0451   PLT 405 12/16/2015 0451   MCV 86.8 12/16/2015 0451   Comprehensive Metabolic Panel:    Component Value Date/Time   NA 141 12/16/2015 0451   K 3.2* 12/16/2015 0451   CL 111 12/16/2015 0451   CO2 25 12/16/2015 0451   BUN 12 12/16/2015 0451   CREATININE 0.93 12/16/2015 0451   GLUCOSE 104* 12/16/2015 0451   CALCIUM 8.7* 12/16/2015 0451    More than 50% of the visit was spent in counseling/coordination of care: Yes  Time Spent: 55 minutes

## 2015-12-17 ENCOUNTER — Encounter
Admission: EM | Disposition: A | Discharge status: Home Health | Payer: Self-pay | Source: Home / Self Care | Attending: Internal Medicine

## 2015-12-17 ENCOUNTER — Inpatient Hospital Stay: Payer: Medicaid Other | Admitting: Anesthesiology

## 2015-12-17 ENCOUNTER — Encounter: Payer: Self-pay | Admitting: Anesthesiology

## 2015-12-17 LAB — BASIC METABOLIC PANEL
ANION GAP: 7 (ref 5–15)
BUN: 23 mg/dL — ABNORMAL HIGH (ref 6–20)
CO2: 23 mmol/L (ref 22–32)
Calcium: 8.6 mg/dL — ABNORMAL LOW (ref 8.9–10.3)
Chloride: 112 mmol/L — ABNORMAL HIGH (ref 101–111)
Creatinine, Ser: 1.11 mg/dL (ref 0.61–1.24)
GFR calc Af Amer: >60 mL/min (ref >60)
Glucose, Bld: 109 mg/dL — ABNORMAL HIGH (ref 65–99)
POTASSIUM: 4 mmol/L (ref 3.5–5.1)
SODIUM: 142 mmol/L (ref 135–145)

## 2015-12-17 SURGERY — LOWER EXTREMITY ANGIOGRAPHY
Anesthesia: General | Laterality: Left

## 2015-12-17 SURGERY — LOWER EXTREMITY ANGIOGRAPHY
Anesthesia: General

## 2015-12-17 NOTE — Progress Notes (Signed)
Anesthesia has again been unable to provide staff to perform his procedure today despite the patient already being on the schedule.  Will try to do again tomorrow but not clear that anesthesia is willing to provide staff tomorrow either.

## 2015-12-17 NOTE — Anesthesia Preprocedure Evaluation (Deleted)
Anesthesia Evaluation  Patient identified by MRN, date of birth, ID band Patient awake    Reviewed: Allergy & Precautions, NPO status , Patient's Chart, lab work & pertinent test results, reviewed documented beta blocker date and time   Airway Mallampati: II  TM Distance: >3 FB     Dental  (+) Chipped   Pulmonary neg pulmonary ROS,           Cardiovascular hypertension, Pt. on medications      Neuro/Psych CVA negative psych ROS   GI/Hepatic negative GI ROS,   Endo/Other  negative endocrine ROS  Renal/GU Renal InsufficiencyRenal disease  negative genitourinary   Musculoskeletal negative musculoskeletal ROS (+)   Abdominal   Peds negative pediatric ROS (+)  Hematology negative hematology ROS (+)   Anesthesia Other Findings Takes plavix. Stroke. Dementia.  Reproductive/Obstetrics                             Anesthesia Physical  Anesthesia Plan  ASA: III  Anesthesia Plan: General   Post-op Pain Management:    Induction: Intravenous  Airway Management Planned: Oral ETT  Additional Equipment:   Intra-op Plan:   Post-operative Plan:   Informed Consent: I have reviewed the patients History and Physical, chart, labs and discussed the procedure including the risks, benefits and alternatives for the proposed anesthesia with the patient or authorized representative who has indicated his/her understanding and acceptance.     Plan Discussed with: CRNA and Surgeon  Anesthesia Plan Comments:         Anesthesia Quick Evaluation

## 2015-12-17 NOTE — Progress Notes (Signed)
[facility] [place] [facility]   PATIENT NAME: Benzion Mesta    MR#:  [id number]  DATE OF BIRTH:  1941/09/27  SUBJECTIVE:   Patient is planned for angiogram today.  REVIEW OF SYSTEMS:    Review of Systems  Constitutional: Negative for fever, chills and malaise/fatigue.  HENT: Negative for sore throat.   Eyes: Negative for blurred vision.  Respiratory: Negative for cough, hemoptysis, shortness of breath and wheezing.   Cardiovascular: Negative for chest pain, palpitations and leg swelling.  Gastrointestinal: Negative for nausea, vomiting, abdominal pain, diarrhea and blood in stool.  Genitourinary: Negative for dysuria.  Musculoskeletal: Negative for back pain.  Neurological: Negative for dizziness, tremors and headaches.  Endo/Heme/Allergies: Does not bruise/bleed easily.    Tolerating Diet: Nothing by mouth   DRUG ALLERGIES:  No Known Allergies  VITALS:  Blood pressure 175/83, pulse 92, temperature 99.7 F (37.6 C), temperature source Oral, resp. rate 17, height 5' (1.524 m), weight 70.262 kg (154 lb 14.4 oz), SpO2 96 %.  PHYSICAL EXAMINATION:   Physical Exam  Constitutional: He is oriented to person, place, and time and well-developed, well-nourished, and in no distress. No distress.  HENT:  Head: Normocephalic.  Eyes: No scleral icterus.  Neck: No JVD present. No tracheal deviation present.  Cardiovascular: Normal rate, regular rhythm and normal heart sounds.  Exam reveals no gallop and no friction rub.   No murmur heard. Pulmonary/Chest: Effort normal and breath sounds normal. No respiratory distress. He has no wheezes. He has no rales. He exhibits no tenderness.  Abdominal: Soft. Bowel sounds are normal. He exhibits no distension and no mass. There is no tenderness. There is no rebound and no guarding.  Musculoskeletal: Normal range of motion. He exhibits no edema.  Contracture of lower extremity  Neurological: He is alert and  oriented to person, place, and time.  Skin: Skin is warm.  Bilateral wound VAC placed      LABORATORY PANEL:   CBC  Recent Labs Lab 12/16/15 0451  WBC 9.2  HGB 10.0*  HCT 30.3*  PLT 405   ------------------------------------------------------------------------------------------------------------------  Chemistries   Recent Labs Lab 12/17/15 0426  NA 142  K 4.0  CL 112*  CO2 23  GLUCOSE 109*  BUN 23*  CREATININE 1.11  CALCIUM 8.6*   ------------------------------------------------------------------------------------------------------------------  Cardiac Enzymes No results for input(s): TROPONINI in the last 168 hours. ------------------------------------------------------------------------------------------------------------------  RADIOLOGY:  Dg Chest Port 1 View  12/15/2015  CLINICAL DATA:  PICC line placement EXAM: PORTABLE CHEST 1 VIEW COMPARISON:  None. FINDINGS: Right PICC line is in place with the tip at the cavoatrial junction. There is mild vascular congestion. Low lung volumes without confluent opacity or effusion. No acute bony abnormality. Heart is borderline in size. IMPRESSION: Right PICC line tip at the cavoatrial junction. Mild vascular congestion Electronically Signed   By: Charlett Nose M.D.   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  1. Bilateral Heel ulcers:   Wound cultures positive for Proteus and Morganella. Continue Rocephin Appreciate ID and podiatry consultation. Angiogram planned for today by vascular surgery May be hard to salvage limbs and it has been discussed with patient may need BKA. Patient has a PICC line in place and will need IV Rocephin for 6 weeks.  Continue Mepitel silicone contact layer to wound bed. Cover with 4x4 gauze.  2. Essential hypertension:Continue metoprolol 50 mg by mouth  twice a day  3. Hx of CVA: Patient is on Plavix.  Continue Dysphagia 1 diet and ENSURE  4. Urinary tract infection: He has been treated. There is no urine culture. 5. Hypokalemia: Repleted  6. Contractures: Consult orthopedics to evaluate for possible septic joint. 6. Acute on chronic anemia: Patient has blood loss from surgery. No indication for blood transfusion at this time. HGb stable   CODE STATUS: FULL  TOTAL TIME TAKING CARE OF THIS PATIENT: 25 minutes.  Discussed with wife at bedside   POSSIBLE D/C tomorrow DEPENDING ON CLINICAL CONDITION.   Darius Lundberg M.D on 12/17/2015 at 11:47 AM  Between 7am to 6pm - Pager - [phone] After 6pm go to [email or web address] - password [email or web address] [facility]  [facility]  Office  [phone]  CC: Primary care physician; No PCP Per Patient  Note: This dictation was prepared with Dragon dictation along with smaller phrase technology. Any transcriptional errors that result from this process are unintentional.

## 2015-12-17 NOTE — Progress Notes (Signed)
[facility] CLINIC INFECTIOUS DISEASE PROGRESS NOTE Date of Admission:  12/09/2015     ID: Jeremiah Scott is a 75 y.o. male with bil heel ulcers and osteomyelitis  Active Problems:   Wound infection (HCC)   Pressure ulcer   Heel ulcer (HCC)  Subjective: Angiogram delayed.  Spoke with wife Jeremiah Scott  ctx will be costly with no insurance ROS  Eleven systems are reviewed and negative except per hpi  Medications:  Antibiotics Given (last 72 hours)    Date/Time Action Medication Dose Rate   12/14/15 2151 Given   piperacillin-tazobactam (ZOSYN) IVPB 3.375 g 3.375 g 12.5 mL/hr   12/15/15 0538 Given   piperacillin-tazobactam (ZOSYN) IVPB 3.375 g 3.375 g 12.5 mL/hr   12/15/15 1300 Given   piperacillin-tazobactam (ZOSYN) IVPB 3.375 g 3.375 g 12.5 mL/hr   12/15/15 1522 Given   cefTRIAXone (ROCEPHIN) 2 g in dextrose 5 % 50 mL IVPB 2 g 100 mL/hr   12/16/15 1435 Given   cefTRIAXone (ROCEPHIN) 2 g in dextrose 5 % 50 mL IVPB 2 g 100 mL/hr   12/17/15 1416 Given   cefTRIAXone (ROCEPHIN) 2 g in dextrose 5 % 50 mL IVPB 2 g 100 mL/hr     . alteplase  2 mg Intracatheter Once  . cefTRIAXone (ROCEPHIN)  IV  2 g Intravenous Q24H  . famotidine  20 mg Oral Daily  . feeding supplement (ENSURE ENLIVE)  237 mL Oral BID BM  . metoprolol tartrate  50 mg Oral BID  . sodium chloride flush  10 mL Intravenous Q12H    Objective: Vital signs in last 24 hours: Temp:  [97.8 F (36.6 C)-99.7 F (37.6 C)] 99.7 F (37.6 C) (02/08 0758) Pulse Rate:  [73-92] 92 (02/08 0801) Resp:  [17-20] 17 (02/08 0758) BP: (164-175)/(63-86) 175/83 mmHg (02/08 0801) SpO2:  [96 %-99 %] 96 % (02/08 0758) Constitutional: frail, contracted in bed, aphasia but says a few words, family at bedside HENT: anicteric Mouth/Throat: Oropharynx is clear and dry . No oropharyngeal exudate.  Cardiovascular: Normal rate, regular rhythm and normal heart sounds.  Pulmonary/Chest: Effort normal and breath sounds normal. No respiratory distress.  He has no wheezes.  Abdominal: Soft. Bowel sounds are normal. He exhibits no distension. There is no tenderness.  Lymphadenopathy: He has no cervical adenopathy.  Neurological: aphasia, EXT bil LE contractures Skin: bil heel with wound vac  Lab Results  Recent Labs  12/15/15 0542 12/16/15 0451 12/17/15 0426  WBC 10.8* 9.2  --   HGB 10.7* 10.0*  --   HCT 32.6* 30.3*  --   NA 142 141 142  K 3.5 3.2* 4.0  CL 110 111 112*  CO2 26 25 23   BUN 13 12 23*  CREATININE 1.00 0.93 1.11    Microbiology: Results for orders placed or performed during the hospital encounter of 12/09/15  Blood culture (routine x 2)     Status: None   Collection Time: 12/09/15  7:33 PM  Result Value Ref Range Status   Specimen Description BLOOD RIGHT ARM  Final   Special Requests BOTTLES DRAWN AEROBIC AND ANAEROBIC 3CC  Final   Culture NO GROWTH 7 DAYS  Final   Report Status 12/16/2015 FINAL  Final  Blood culture (routine x 2)     Status: None   Collection Time: 12/09/15  7:33 PM  Result Value Ref Range Status   Specimen Description BLOOD LEFT HAND  Final   Special Requests BOTTLES DRAWN AEROBIC AND ANAEROBIC 2CC  Final   Culture NO  GROWTH 7 DAYS  Final   Report Status 12/16/2015 FINAL  Final  Wound culture     Status: None   Collection Time: 12/09/15 11:53 PM  Result Value Ref Range Status   Specimen Description FOOT  Final   Special Requests NONE  Final   Gram Stain RARE WBC SEEN FEW GRAM NEGATIVE RODS   Final   Culture   Final    LIGHT GROWTH PROTEUS MIRABILIS LIGHT GROWTH MORGANELLA MORGANII    Report Status 12/16/2015 FINAL  Final   Organism ID, Bacteria PROTEUS MIRABILIS  Final   Organism ID, Bacteria MORGANELLA MORGANII  Final      Susceptibility   Morganella morganii - MIC*    AMPICILLIN Value in next row Resistant      RESISTANT>=32    CEFAZOLIN Value in next row Resistant      RESISTANT>=64    CEFEPIME Value in next row Sensitive      SENSITIVE<=1    CEFTAZIDIME Value in next  row Sensitive      SENSITIVE<=1    CEFTRIAXONE Value in next row Sensitive      SENSITIVE<=1    CIPROFLOXACIN Value in next row Resistant      RESISTANT>=4    GENTAMICIN Value in next row Sensitive      SENSITIVE<=1    IMIPENEM Value in next row Sensitive      SENSITIVE2    TRIMETH/SULFA Value in next row Sensitive      SENSITIVE<=20    AMPICILLIN/SULBACTAM Value in next row Resistant      RESISTANT>=32    PIP/TAZO Value in next row Sensitive      SENSITIVE<=4    * LIGHT GROWTH MORGANELLA MORGANII   Proteus mirabilis - MIC*    AMPICILLIN Value in next row Sensitive      SENSITIVE<=4    CEFAZOLIN Value in next row Sensitive      SENSITIVE<=4    CEFEPIME Value in next row Sensitive      SENSITIVE<=4    CEFTAZIDIME Value in next row Sensitive      SENSITIVE<=4    CEFTRIAXONE Value in next row Sensitive      SENSITIVE<=4    CIPROFLOXACIN Value in next row Sensitive      SENSITIVE<=4    GENTAMICIN Value in next row Sensitive      SENSITIVE<=4    IMIPENEM Value in next row Sensitive      SENSITIVE<=4    TRIMETH/SULFA Value in next row Sensitive      SENSITIVE<=4    AMPICILLIN/SULBACTAM Value in next row Sensitive      SENSITIVE<=4    PIP/TAZO Value in next row Sensitive      SENSITIVE<=4    * LIGHT GROWTH PROTEUS MIRABILIS  MRSA PCR Screening     Status: None   Collection Time: 12/12/15  9:04 AM  Result Value Ref Range Status   MRSA by PCR NEGATIVE NEGATIVE Final    Comment:        The GeneXpert MRSA Assay (FDA approved for NASAL specimens only), is one component of a comprehensive MRSA colonization surveillance program. It is not intended to diagnose MRSA infection nor to guide or monitor treatment for MRSA infections.   Wound culture     Status: None   Collection Time: 12/12/15  1:29 PM  Result Value Ref Range Status   Specimen Description WOUND RIGHT HEEL  Final   Special Requests NONE  Final   Gram Stain FEW WBC SEEN FEW  GRAM NEGATIVE RODS   Final    Culture   Final    LIGHT GROWTH PROTEUS MIRABILIS LIGHT GROWTH MORGANELLA MORGANII    Report Status 12/16/2015 FINAL  Final   Organism ID, Bacteria PROTEUS MIRABILIS  Final   Organism ID, Bacteria MORGANELLA MORGANII  Final      Susceptibility   Morganella morganii - MIC*    CEFTAZIDIME Value in next row Sensitive      SENSITIVE<=1    CEFEPIME Value in next row Sensitive      SENSITIVE<=1    IMIPENEM Value in next row Sensitive      SENSITIVE2    GENTAMICIN Value in next row Sensitive      SENSITIVE<=1    CIPROFLOXACIN Value in next row Resistant      RESISTANT>=4    TRIMETH/SULFA Value in next row Sensitive      SENSITIVE<=20    * LIGHT GROWTH MORGANELLA MORGANII   Proteus mirabilis - MIC*    AMPICILLIN Value in next row Sensitive      SENSITIVE<=20    CEFAZOLIN Value in next row Sensitive      SENSITIVE<=20    CEFEPIME Value in next row Sensitive      SENSITIVE<=20    CEFTAZIDIME Value in next row Sensitive      SENSITIVE<=20    CEFTRIAXONE Value in next row Sensitive      SENSITIVE<=20    CIPROFLOXACIN Value in next row Sensitive      SENSITIVE<=20    GENTAMICIN Value in next row Sensitive      SENSITIVE<=20    IMIPENEM Value in next row Sensitive      SENSITIVE<=20    TRIMETH/SULFA Value in next row Sensitive      SENSITIVE<=20    AMPICILLIN/SULBACTAM Value in next row Sensitive      SENSITIVE<=20    PIP/TAZO Value in next row Sensitive      SENSITIVE<=20    * LIGHT GROWTH PROTEUS MIRABILIS  Wound culture     Status: None   Collection Time: 12/12/15  1:29 PM  Result Value Ref Range Status   Specimen Description WOUND LEFT HEEL  Final   Special Requests NONE  Final   Gram Stain   Final    FEW WBC SEEN FEW GRAM NEGATIVE RODS RARE GRAM POSITIVE COCCI    Culture MODERATE PROTEUS MIRABILIS  Final   Report Status 12/15/2015 FINAL  Final   Organism ID, Bacteria PROTEUS MIRABILIS  Final      Susceptibility   Proteus mirabilis - MIC*    AMPICILLIN <=2 SENSITIVE  Sensitive     CEFAZOLIN <=4 SENSITIVE Sensitive     CEFEPIME <=1 SENSITIVE Sensitive     CEFTAZIDIME <=1 SENSITIVE Sensitive     CEFTRIAXONE <=1 SENSITIVE Sensitive     CIPROFLOXACIN <=0.25 SENSITIVE Sensitive     GENTAMICIN <=1 SENSITIVE Sensitive     IMIPENEM 2 SENSITIVE Sensitive     TRIMETH/SULFA <=20 SENSITIVE Sensitive     AMPICILLIN/SULBACTAM <=2 SENSITIVE Sensitive     PIP/TAZO <=4 SENSITIVE Sensitive     * MODERATE PROTEUS MIRABILIS  C difficile quick scan w PCR reflex     Status: None   Collection Time: 12/14/15  3:15 PM  Result Value Ref Range Status   C Diff antigen NEGATIVE NEGATIVE Final   C Diff toxin NEGATIVE NEGATIVE Final   C Diff interpretation Negative for C. difficile  Final    Studies/Results: Dg Chest Port 1 View  12/15/2015  CLINICAL DATA:  PICC line placement EXAM: PORTABLE CHEST 1 VIEW COMPARISON:  None. FINDINGS: Right PICC line is in place with the tip at the cavoatrial junction. There is mild vascular congestion. Low lung volumes without confluent opacity or effusion. No acute bony abnormality. Heart is borderline in size. IMPRESSION: Right PICC line tip at the cavoatrial junction. Mild vascular congestion Electronically Signed   By: Charlett Nose M.D.   On: 12/15/2015 18:06   Dg Foot 2 Views Left  12/09/2015  CLINICAL DATA:  Bilateral heel ulcers. EXAM: LEFT FOOT - 2 VIEW; RIGHT FOOT - 2 VIEW COMPARISON:  None. FINDINGS: Left foot: Mild to moderate degenerative changes most notably at the first metatarsal phalangeal joint. Extensive vascular calcifications are noted. There is a large open wound involving the heel. No obvious destructive bony changes involving the calcaneus to suggest osteomyelitis. Calcaneal spurring changes are noted. Right foot: Mild to moderate degenerative changes most notably at the first metatarsal phalangeal joint. Extensive vascular calcifications. Soft tissue defect involving the heel but no obvious destructive bony changes to suggest  osteomyelitis. IMPRESSION: Bilateral heel ulcers without obvious plain film findings for underlying osteomyelitis. MRI may be helpful for further evaluation. Electronically Signed   By: Rudie Meyer M.D.   On: 12/09/2015 18:34   Dg Foot 2 Views Right  12/09/2015  CLINICAL DATA:  Bilateral heel ulcers. EXAM: LEFT FOOT - 2 VIEW; RIGHT FOOT - 2 VIEW COMPARISON:  None. FINDINGS: Left foot: Mild to moderate degenerative changes most notably at the first metatarsal phalangeal joint. Extensive vascular calcifications are noted. There is a large open wound involving the heel. No obvious destructive bony changes involving the calcaneus to suggest osteomyelitis. Calcaneal spurring changes are noted. Right foot: Mild to moderate degenerative changes most notably at the first metatarsal phalangeal joint. Extensive vascular calcifications. Soft tissue defect involving the heel but no obvious destructive bony changes to suggest osteomyelitis. IMPRESSION: Bilateral heel ulcers without obvious plain film findings for underlying osteomyelitis. MRI may be helpful for further evaluation. Electronically Signed   By: Rudie Meyer M.D.   On: 12/09/2015 18:34    Assessment:  Yohan Samons is a 75 y.o. 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 Cx t with proteus and morganella so far. MRSA PCR neg S/p Debridement to bone by Podiatry  with wound vac in place. I spoke with wife and sister again. I again suggested AKA or BKA as best way forward as there is a very high risk he will end up with this despite aggressive care and prolonged IV abx. Wife remains steadfast in her opinion to try revascularization, wound care and IV abx  Recommendations Continue ceftriaxone - will need 6 weeks IV abx He has picc If he cannot have IV abx due to picc line issues (due to pulling it out, etc) may consider oral bactrim but this would be suboptimal.   I discussed this with  patient's wife as well.  Jeremiah Scott   12/17/2015, 2:53 PM

## 2015-12-18 MED ORDER — CLOPIDOGREL BISULFATE 75 MG PO TABS
75.0000 mg | ORAL_TABLET | Freq: Every day | ORAL | Status: DC
  Administered 2015-12-18 – 2015-12-19 (×2): 75 mg via ORAL
  Filled 2015-12-18 (×2): qty 1

## 2015-12-18 NOTE — Progress Notes (Signed)
Per Dr. Wyn Quaker and radiology, keep patient NPO in case anesthesia will be able to provide staffing for angiogram. Radiology states they will call me back with confirmation. Patient and wife notified of update.

## 2015-12-18 NOTE — Progress Notes (Signed)
Infectious Disease Long Term IV Antibiotic Orders  Diagnosis: Bilateral heel osteomyelitis Jeremiah Scott is a 75 y.o. 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 Cx t with proteus and morganella so far. MRSA PCR neg S/p Debridement to bone by Podiatry 12/12/15 with wound vac in place.  Allergies: No Known Allergies  Discharge antibiotics Ceftriaxone 2 grams every 24 hours  PICC Care per protocol Labs weekly while on IV antibiotics      CBC w diff   Comprehensive met panel CRP   Planned duration of antibiotics 6 weeks   Stop date  01/22/16   Follow up clinic date No Need for ID clinc fu FAX weekly labs to [phone]  Leonel Ramsay, MD

## 2015-12-18 NOTE — Progress Notes (Signed)
[facility] [place] [facility]   PATIENT NAME: Jeremiah Scott    MR#:  [id number]  DATE OF BIRTH:  1941/03/14  SUBJECTIVE:  Angiogram was canceled yesterday due to staffing issue. No acute events overnight.  REVIEW OF SYSTEMS:    Review of Systems  Constitutional: Negative for fever, chills and malaise/fatigue.  HENT: Negative for sore throat.   Eyes: Negative for blurred vision.  Respiratory: Negative for cough, hemoptysis, shortness of breath and wheezing.   Cardiovascular: Negative for chest pain, palpitations and leg swelling.  Gastrointestinal: Negative for nausea, vomiting, abdominal pain, diarrhea and blood in stool.  Genitourinary: Negative for dysuria.  Musculoskeletal: Negative for back pain.  Neurological: Negative for dizziness, tremors and headaches.  Endo/Heme/Allergies: Does not bruise/bleed easily.    Tolerating Diet: Yes    DRUG ALLERGIES:  No Known Allergies  VITALS:  Blood pressure 171/78, pulse 87, temperature 98 F (36.7 C), temperature source Oral, resp. rate 18, height 5' (1.524 m), weight 70.362 kg (155 lb 1.9 oz), SpO2 100 %.  PHYSICAL EXAMINATION:   Physical Exam  Constitutional: He is oriented to person, place, and time and well-developed, well-nourished, and in no distress. No distress.  HENT:  Head: Normocephalic.  Eyes: No scleral icterus.  Neck: No JVD present. No tracheal deviation present.  Cardiovascular: Normal rate, regular rhythm and normal heart sounds.  Exam reveals no gallop and no friction rub.   No murmur heard. Pulmonary/Chest: Effort normal and breath sounds normal. No respiratory distress. He has no wheezes. He has no rales. He exhibits no tenderness.  Abdominal: Soft. Bowel sounds are normal. He exhibits no distension and no mass. There is no tenderness. There is no rebound and no guarding.  Musculoskeletal: Normal range of motion. He exhibits no edema.  Contracture of lower extremity   Neurological: He is alert and oriented to person, place, and time.  Skin: Skin is warm.  Bilateral wound VAC placed      LABORATORY PANEL:   CBC  Recent Labs Lab 12/16/15 0451  WBC 9.2  HGB 10.0*  HCT 30.3*  PLT 405   ------------------------------------------------------------------------------------------------------------------  Chemistries   Recent Labs Lab 12/17/15 0426  NA 142  K 4.0  CL 112*  CO2 23  GLUCOSE 109*  BUN 23*  CREATININE 1.11  CALCIUM 8.6*   ------------------------------------------------------------------------------------------------------------------  Cardiac Enzymes No results for input(s): TROPONINI in the last 168 hours. ------------------------------------------------------------------------------------------------------------------  RADIOLOGY:  No results found.   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  1. Bilateral Heel ulcers:   Wound cultures positive for Proteus and Morganella. Continue Rocephin-he will need 6 weeks of IV antibiotics. Appreciate ID and podiatry consultation. Angiogram planned for whenever staffing available.  This can also be done as an outpatient.  May be hard to salvage limbs and it has been discussed with patient may need BKA, however family is not ready for this option. Patient has a PICC line in place and will need IV Rocephin for 6 weeks.  Continue Mepitel silicone contact layer to wound bed. Cover with 4x4 gauze.   2. Essential hypertension:Continue metoprolol 50 mg by mouth twice a day  3. Hx of CVA: Patient is on Plavix.  Continue Dysphagia 1 diet and ENSURE  4. Urinary tract infection: He has been treated. There is no urine culture. 5. Hypokalemia: Repleted  6. Contractures: Orthopedics has been contacted for evaluation of  bilateral knees following steroid injection.   6.  Acute on chronic anemia: Patient had blood loss from surgery. No indication for blood transfusion at this time. HGb stable   CODE STATUS: FULL  TOTAL TIME TAKING CARE OF THIS PATIENT: 22 minutes.  Discussed with wife at bedside   POSSIBLE D/C tomorrow DEPENDING ON CLINICAL CONDITION.   Sherryl Valido M.D on 12/18/2015 at 10:11 AM  Between 7am to 6pm - Pager - [phone] After 6pm go to [email or web address] - password [email or web address] [facility]  [place] [facility]  Office  [phone]  CC: Primary care physician; No PCP Per Patient  Note: This dictation was prepared with Dragon dictation along with smaller phrase technology. Any transcriptional errors that result from this process are unintentional.

## 2015-12-18 NOTE — Progress Notes (Signed)
Once again, anesthesia has not agreed to providing staff to be able to do his angiogram today.  This is the third time this week that the case was unable to be done due to anesthesia issues.   I would recommend discharging patient whenever medically stable and I will plan to do this as an outpatient. I can not do tomorrow as I am in clinic and patient should not stay over the weekend (again) to wait for an angiogram.   Sorry for the inconvenience, but given his contractures this procedure will require general anesthesia and I have been unable to get anesthesia to do this thus far

## 2015-12-18 NOTE — Progress Notes (Signed)
PT Cancellation Note  Patient Details Name: Raywood Wailes MRN: [id number] DOB: Jul 27, 1941   Cancelled Treatment:    Reason Eval/Treat Not Completed: Medical issues which prohibited therapy (Consult received and chart reviewed.  CNA at bedside for vitals assessment beginning of session; patient noted to be hypertensive, requiring PRN meds.  RN recommended hold on this time to minimize agitation and further increase in BP.)  Of note, wife at bedside and able to provide some PLOF.  Patient lives with wife (available for 24 hour support) in 2nd story living environment.  Family carries patient up/down stairs in [facility] when necessary for entry/exit of home.  Patient largely bedbound and non-ambulatory, total care (has been for at least 3-4 months per chart).    Patient noted to be in significant flexion contracture, windswept to L.  Very agitated with attempts to reposition patient in bed (let alone perform thorough PT evaluation).  Question appropriateness of skilled PT intervention at this time given inability to follow commands, baseline functional status and limited ability to make functional progress at this time.  Will re-attempt one additional time next date.   Chanita Boden H. Manson Passey, PT, DPT, NCS 12/18/2015, 5:03 PM [phone]

## 2015-12-18 NOTE — Progress Notes (Signed)
Per Dr Wyn Quaker, angiogram cannot be completed today or tomorrow.

## 2015-12-18 NOTE — Care Management (Signed)
[facility] [employer] has agreed to paying only for 2 weeks HHRN at $150 per visit and 2 weeks IV Rocephin. They will cover wound VAC and materials. I am waiting Patient has PCP appointment for 12/19/15 which may need to be rescheduled if not discharged. Paper work done for [employer] yesterday per daughter. Daughter informed of plan. I have asked Barbara Cower with [employer] to talk to daughter about charity to see if patient can get more assistance.

## 2015-12-18 NOTE — Consult Note (Signed)
  ORTHOPAEDIC CONSULTATION  REQUESTING PHYSICIAN: Adrian Saran, MD  Chief Complaint: Knee contractures  HPI: Jeremiah Scott is a 75 y.o. male who complains of  knee contractures.  Patient has severe deep ulcerations of both heels which is being treated by the podiatry service and infectious disease and wound care.  He walked until about 2 months ago his wife says.  Currently he maintains a hyperflexed attitude to both knees.  He has vascular disease and consideration is being undertaken concerning amputations.  Past Medical History  Diagnosis Date  . CVA (cerebral infarction)   . Hypertension    Past Surgical History  Procedure Laterality Date  . Irrigation and debridement foot Bilateral 12/12/2015    Procedure: IRRIGATION AND DEBRIDEMENT FOOT;  Surgeon: Gwyneth Revels, DPM;  Location: [facility] ORS;  Service: Podiatry;  Laterality: Bilateral;  . Application of wound vac Bilateral 12/12/2015    Procedure: APPLICATION OF WOUND VAC;  Surgeon: Gwyneth Revels, DPM;  Location: [facility] ORS;  Service: Podiatry;  Laterality: Bilateral;   Social History   Social History  . Marital Status: Married    Spouse Name: N/A  . Number of Children: N/A  . Years of Education: N/A   Social History Main Topics  . Smoking status: Never Smoker   . Smokeless tobacco: None  . Alcohol Use: No  . Drug Use: No  . Sexual Activity: Not Asked   Other Topics Concern  . None   Social History Narrative   History reviewed. No pertinent family history. No Known Allergies Prior to Admission medications   Medication Sig Start Date End Date Taking? Authorizing Provider  clopidogrel (PLAVIX) 75 MG tablet Take 75 mg by mouth daily.   Yes Historical Provider, MD  mupirocin ointment (BACTROBAN) 2 % Apply 1 application topically 3 (three) times daily.   Yes Historical Provider, MD  NIFEdipine (PROCARDIA) 20 MG capsule Take 20 mg by mouth daily.   Yes Historical Provider, MD   No results found.  Positive ROS: All other systems  have been reviewed and were otherwise negative with the exception of those mentioned in the HPI and as above.  Physical Exam: General: Alert, no acute distress Cardiovascular: No pedal edema Respiratory: No cyanosis, no use of accessory musculature GI: No organomegaly, abdomen is soft and non-tender Skin: No lesions in the area of chief complaint Neurologic: Sensation intact distally Psychiatric: Patient is competent for consent with normal mood and affect Lymphatic: No axillary or cervical lymphadenopathy  MUSCULOSKELETAL: Patient lies in bed with both knees hyperflexed.  There is no apparent swelling or redness or cellulitis in either knee.  He complains of significant pain with attempts to passively extend the knees.  Dressings on his heels were left untouched.  Assessment: Flexion contractures both knees.  Plan: I did not detect any obvious pathology in either knee.  My understanding is that revascularization procedures are the wife's first choice.  Those were successful, then amputation would not be a consideration.  Physical therapy should try to work on getting some extension of the knees.  Knee immobilizers would be helpful if they were able to achieve some improvement here.  Below the knee amputations would be acceptable if PT could regain some extension.    Valinda Hoar, MD [phone]   12/18/2015 10:32 AM

## 2015-12-19 MED ORDER — METOPROLOL TARTRATE 50 MG PO TABS: 50.0000 mg | ORAL_TABLET | Freq: Two times a day (BID) | ORAL | Status: AC

## 2015-12-19 MED ORDER — METOPROLOL TARTRATE 75 MG PO TABS: 50.0000 mg | ORAL_TABLET | Freq: Two times a day (BID) | ORAL | Status: DC

## 2015-12-19 MED ORDER — ENSURE ENLIVE PO LIQD: 237.0000 mL | Freq: Two times a day (BID) | ORAL | Status: AC

## 2015-12-19 MED ORDER — DEXTROSE 5 % IV SOLN: 2.0000 g | INTRAVENOUS | Status: AC

## 2015-12-19 MED ORDER — CLOPIDOGREL BISULFATE 75 MG PO TABS: 75.0000 mg | ORAL_TABLET | Freq: Every day | ORAL | Status: AC

## 2015-12-19 MED ORDER — DEXTROSE 5 % IV SOLN: 2.0000 g | INTRAVENOUS | Status: DC

## 2015-12-19 NOTE — Care Management (Addendum)
Spoke with patient's daughter and she is worried about taking patient home in vehicle with PICC line and VAC. Patient has not been able to stand since "November" per daughter and she has been "picking him up and putting him in the car". I explained cost of EMS ~$400 and she cannot afford it.That will need to be assessed however, PT was not able to work with patient yesterday. His PCP appointment is today at 0900 and she states she cannot make to that appointment due to "teaching on wound care and IV infusion prior to discharge today". She is aware of patient discharge today. I have notified Lonn Georgia RN with [facility] of patient discharge. RN will administer Rocephin prior to discharge today. Waiting for [facility] to deliver [facility] to this room. [facility] will pay for EMS transportation home today. No further RNCM needs. Case closed.

## 2015-12-19 NOTE — Care Management (Signed)
Home health orders modified for [facility] to change VAC dressings 3 times per week and measure wounds weekly reporting to Dr. Ether Griffins. No further RNCM needs.

## 2015-12-19 NOTE — Discharge Summary (Signed)
[facility] [place] [facility]   PATIENT NAME: Jeremiah Scott    MR#:  [id number]  DATE OF BIRTH:  Apr 11, 1941  DATE OF ADMISSION:  12/09/2015 ADMITTING PHYSICIAN: Adrian Saran, MD  DATE OF DISCHARGE: 12/19/2015  PRIMARY CARE PHYSICIAN: No PCP Per Patient    ADMISSION DIAGNOSIS:  Heel ulcer, unspecified laterality, with unspecified severity (HCC) [L97.409]  DISCHARGE DIAGNOSIS:  Active Problems:   Wound infection (HCC)   Pressure ulcer   Heel ulcer (HCC)   SECONDARY DIAGNOSIS:   Past Medical History  Diagnosis Date  . CVA (cerebral infarction)   . Hypertension     HOSPITAL COURSE:    75 year old male with history of stoke with residual aphasia who presents with bilateral heel ulcers from wound care clinic.  1. Bilateral Heel ulcers:  Wound cultures were positive for Proteus and Morganella. He was started on Rocephin and as per ID recommendations he needs to continue Rocephin for 6 weeks. He has a PICC line.  Angiogram was attempted several times by vascular surgery. However, anesthesiology services were not available. Patient will have this planned as an outpatient. It may be hard to salvage limbs and it has been discussed with patient may need BKA, however family is not ready for this option.  Patient will follow up with podiatry in 1 week and infectious disease in 2 weeks..  The wound care he will ontinue Mepitel silicone contact layer to wound bed. Cover with 4x4 gauze.   2. Essential hypertension:Continue metoprolol 75 mg by mouth twice a day discharge.  3. Hx of CVA: Patient is on Plavix.  Continue Dysphagia 1 diet and ENSURE  4. Urinary tract infection: He has been treated. There is no urine culture. 5. Hypokalemia: Repleted  6. Contractures: Orthopedics surgery had no further recommendations. They did not detect any obvious pathology in either knee. 7 Acute on chronic anemia: Patient had blood loss from surgery. No  indication for blood transfusion or his hospital stay.    DISCHARGE CONDITIONS AND DIET:    Stable to be discharged home on a heart healthy diet  CONSULTS OBTAINED:  Treatment Team:  Gwyneth Revels, DPM Clydie Braun, MD Deeann Saint, MD  DRUG ALLERGIES:  No Known Allergies  DISCHARGE MEDICATIONS:   Current Discharge Medication List    START taking these medications   Details  cefTRIAXone 2 g in dextrose 5 % 50 mL Inject 2 g into the vein daily. Qty: 100 g, Refills: 0    feeding supplement, ENSURE ENLIVE, (ENSURE ENLIVE) LIQD Take 237 mLs by mouth 2 (two) times daily between meals. Qty: 237 mL, Refills: 12    metoprolol 75 MG TABS Take 50 mg by mouth 2 (two) times daily. Qty: 60 tablet, Refills: 0      CONTINUE these medications which have NOT CHANGED   Details  clopidogrel (PLAVIX) 75 MG tablet Take 75 mg by mouth daily.    mupirocin ointment (BACTROBAN) 2 % Apply 1 application topically 3 (three) times daily.      STOP taking these medications     NIFEdipine (PROCARDIA) 20 MG capsule               Today   CHIEF COMPLAINT:   No acute events overnight   VITAL SIGNS:  Blood pressure 194/95, pulse 89, temperature 98.5 F (36.9 C), temperature source Axillary, resp. rate 18, height 5' (1.524 m), weight 70.272 kg (154 lb 14.8 oz), SpO2 100 %.   REVIEW OF SYSTEMS:  Review  of Systems  Constitutional: Negative for fever, chills and malaise/fatigue.  HENT: Negative for sore throat.   Eyes: Negative for blurred vision.  Respiratory: Negative for cough, hemoptysis, shortness of breath and wheezing.   Cardiovascular: Negative for chest pain, palpitations and leg swelling.  Gastrointestinal: Negative for nausea, vomiting, abdominal pain, diarrhea and blood in stool.  Genitourinary: Negative for dysuria.  Musculoskeletal: Negative for back pain.  Neurological: Negative for dizziness, tremors and headaches.  Endo/Heme/Allergies: Does not bruise/bleed  easily.     PHYSICAL EXAMINATION:  GENERAL:  75 y.o.-year-old patient lying in the bed with no acute distress.  NECK:  Supple, no jugular venous distention. No thyroid enlargement, no tenderness.  LUNGS: Normal breath sounds bilaterally, no wheezing, rales,rhonchi  No use of accessory muscles of respiration.  CARDIOVASCULAR: S1, S2 normal. No murmurs, rubs, or gallops.  ABDOMEN: Soft, non-tender, non-distended. Bowel sounds present. No organomegaly or mass.  EXTREMITIES: Severe lower extremity contractures PSYCHIATRIC: The patient is alert and oriented x 3.  SKIN: Bilateral wound VAC placed  DATA REVIEW:   CBC  Recent Labs Lab 12/16/15 0451  WBC 9.2  HGB 10.0*  HCT 30.3*  PLT 405    Chemistries   Recent Labs Lab 12/17/15 0426  NA 142  K 4.0  CL 112*  CO2 23  GLUCOSE 109*  BUN 23*  CREATININE 1.11  CALCIUM 8.6*    Cardiac Enzymes No results for input(s): TROPONINI in the last 168 hours.  Microbiology Results  @  RADIOLOGY:  No results found.    Management plans discussed with the patient and he is in agreement. Stable for discharge home with [facility] His management assisted with home health care plan Patient should follow up with vascular surgery and podiatry in 1 week and infectious disease in 2 weeks.  CODE STATUS:     Code Status Orders        Start     Ordered   12/09/15 2227  Full code   Continuous     12/09/15 2227    Code Status History    Date Active Date Inactive Code Status Order ID Comments User Context   This patient has a current code status but no historical code status.      TOTAL TIME TAKING CARE OF THIS PATIENT: 35 minutes.    Note: This dictation was prepared with Dragon dictation along with smaller phrase technology. Any transcriptional errors that result from this process are unintentional.  Vienna Folden M.D on 12/19/2015 at 10:51 AM  Between 7am to 6pm - Pager - [phone] After 6pm go to [email or web address] -  password [email or web address] [facility] [place]  [place] [facility]  Office  [phone]  CC: Primary care physician; No PCP Per Patient

## 2015-12-19 NOTE — Progress Notes (Signed)
Dr. Mody called Clinical Social Worker (CSW) and requested for CSW to speak with wife about rehab criteria. CSW met with patient's wife at bedside. Wife asked if patient can go to rehab to get his blood pressure monitored. CSW explained that patient is not appropriate for rehab because he cannot participate in PT. CSW explained SNF process. Wife does not want SNF. Wife is agreeable for patient to D/C home today. RN Case Manager is aware of above. Please reconsult if future social work needs arise. CSW signing off.    Morgan, LCSW [phone] 

## 2015-12-21 ENCOUNTER — Encounter: Payer: Self-pay | Admitting: Emergency Medicine

## 2015-12-21 ENCOUNTER — Emergency Department
Admission: EM | Admit: 2015-12-21 | Discharge: 2015-12-21 | Disposition: A | Discharge status: Home / Self Care | Payer: MEDICAID | Attending: Student | Admitting: Student

## 2015-12-21 DIAGNOSIS — Z79899 Other long term (current) drug therapy: Secondary | ICD-10-CM | POA: Insufficient documentation

## 2015-12-21 DIAGNOSIS — T82898A Other specified complication of vascular prosthetic devices, implants and grafts, initial encounter: Secondary | ICD-10-CM | POA: Insufficient documentation

## 2015-12-21 DIAGNOSIS — Z7902 Long term (current) use of antithrombotics/antiplatelets: Secondary | ICD-10-CM | POA: Insufficient documentation

## 2015-12-21 DIAGNOSIS — I10 Essential (primary) hypertension: Secondary | ICD-10-CM | POA: Insufficient documentation

## 2015-12-21 DIAGNOSIS — Z792 Long term (current) use of antibiotics: Secondary | ICD-10-CM | POA: Insufficient documentation

## 2015-12-21 DIAGNOSIS — Y658 Other specified misadventures during surgical and medical care: Secondary | ICD-10-CM | POA: Insufficient documentation

## 2015-12-21 DIAGNOSIS — R4701 Aphasia: Secondary | ICD-10-CM | POA: Insufficient documentation

## 2015-12-21 HISTORY — DX: Cerebral infarction, unspecified: I63.9

## 2015-12-21 MED ORDER — DEXTROSE 5 % IV SOLN
2.0000 g | Freq: Once | INTRAVENOUS | Status: AC
  Administered 2015-12-21: 2 g via INTRAVENOUS
  Filled 2015-12-21: qty 2

## 2015-12-21 MED ORDER — HEPARIN SOD (PORK) LOCK FLUSH 100 UNIT/ML IV SOLN: 500.0000 [IU] | Freq: Once | INTRAVENOUS | Status: DC

## 2015-12-21 MED ORDER — HEPARIN SOD (PORK) LOCK FLUSH 100 UNIT/ML IV SOLN
500.0000 [IU] | Freq: Once | INTRAVENOUS | Status: DC
  Filled 2015-12-21: qty 5

## 2015-12-21 NOTE — ED Notes (Signed)
Pt. Has a clogged picc line in rt. Upper arm.  Pt. Was just discharged from this hospital last night.  Pt. Has wound vac on both hears of feet, which was applied during stay at hospital.  Pt. Has home health nurse to come to house to give antibiotics.   Home Nurse unable to access picc line.  EMS called to house, EMS unable to access line.

## 2015-12-21 NOTE — ED Notes (Signed)
Attempted to reposition pt on right side. Pt refused by holding on the rail. Unable to understand what pt was saying but was shaking head no when trying to move. However, moved toward middle of bed and pillow placed between pt and rail. Orange juice given to pt. VS recorded. Room darkened. Pending PICC line team for pt eval. No acute needs at this time. NAD. Will continue to monitor.

## 2015-12-21 NOTE — Discharge Instructions (Signed)
Please make sure that your flushing the PICC line twice daily.  Return to the emergency department for fever, changes in mental status, problems with the PICC line, or any other symptoms concerning to you. PICC Home Guide A peripherally inserted central catheter (PICC) is a long, thin, flexible tube that is inserted into a vein in the upper arm. It is a form of intravenous (IV) access. It is considered to be a "central" line because the tip of the PICC ends in a large vein in your chest. This large vein is called the superior vena cava (SVC). The PICC tip ends in the SVC because there is a lot of blood flow in the SVC. This allows medicines and IV fluids to be quickly distributed throughout the body. The PICC is inserted using a sterile technique by a specially trained nurse or physician. After the PICC is inserted, a chest X-ray exam is done to be sure it is in the correct place.  A PICC may be placed for different reasons, such as:  To give medicines and liquid nutrition that can only be given through a central line. Examples are:  Certain antibiotic treatments.  Chemotherapy.  Total parenteral nutrition (TPN).  To take frequent blood samples.  To give IV fluids and blood products.  If there is difficulty placing a peripheral intravenous (PIV) catheter. If taken care of properly, a PICC can remain in place for several months. A PICC can also allow a person to go home from the hospital early. Medicine and PICC care can be managed at home by a family member or home health care team. WHAT PROBLEMS CAN HAPPEN WHEN I HAVE A PICC? Problems with a PICC can occasionally occur. These may include the following:  A blood clot (thrombus) forming in or at the tip of the PICC. This can cause the PICC to become clogged. A clot-dissolving medicine called tissue plasminogen activator (tPA) can be given through the PICC to help break up the clot.  Inflammation of the vein (phlebitis) in which the PICC is  placed. Signs of inflammation may include redness, pain at the insertion site, red streaks, or being able to feel a "cord" in the vein where the PICC is located.  Infection in the PICC or at the insertion site. Signs of infection may include fever, chills, redness, swelling, or pus drainage from the PICC insertion site.  PICC movement (malposition). The PICC tip may move from its original position due to excessive physical activity, forceful coughing, sneezing, or vomiting.  A break or cut in the PICC. It is important to not use scissors near the PICC.  Nerve or tendon irritation or injury during PICC insertion. WHAT SHOULD I KEEP IN MIND ABOUT ACTIVITIES WHEN I HAVE A PICC?  You may bend your arm and move it freely. If your PICC is near or at the bend of your elbow, avoid activity with repeated motion at the elbow.  Rest at home for the remainder of the day following PICC line insertion.  Avoid lifting heavy objects as instructed by your health care provider.  Avoid using a crutch with the arm on the same side as your PICC. You may need to use a walker. WHAT SHOULD I KNOW ABOUT MY PICC DRESSING?  Keep your PICC bandage (dressing) clean and dry to prevent infection.  Ask your health care provider when you may shower. Ask your health care provider to teach you how to wrap the PICC when you do take a shower.  Change the PICC dressing as instructed by your health care provider.  Change your PICC dressing if it becomes loose or wet. WHAT SHOULD I KNOW ABOUT PICC CARE?  Check the PICC insertion site daily for leakage, redness, swelling, or pain.  Do not take a bath, swim, or use hot tubs when you have a PICC. Cover PICC line with clear plastic wrap and tape to keep it dry while showering.  Flush the PICC as directed by your health care provider. Let your health care provider know right away if the PICC is difficult to flush or does not flush. Do not use force to flush the PICC.  Do not  use a syringe that is less than 10 mL to flush the PICC.  Never pull or tug on the PICC.  Avoid blood pressure checks on the arm with the PICC.  Keep your PICC identification card with you at all times.  Do not take the PICC out yourself. Only a trained clinical professional should remove the PICC. SEEK IMMEDIATE MEDICAL CARE IF:  Your PICC is accidentally pulled all the way out. If this happens, cover the insertion site with a bandage or gauze dressing. Do not throw the PICC away. Your health care provider will need to inspect it.  Your PICC was tugged or pulled and has partially come out. Do not  push the PICC back in.  There is any type of drainage, redness, or swelling where the PICC enters the skin.  You cannot flush the PICC, it is difficult to flush, or the PICC leaks around the insertion site when it is flushed.  You hear a "flushing" sound when the PICC is flushed.  You have pain, discomfort, or numbness in your arm, shoulder, or jaw on the same side as the PICC.  You feel your heart "racing" or skipping beats.  You notice a hole or tear in the PICC.  You develop chills or a fever. MAKE SURE YOU:   Understand these instructions.  Will watch your condition.  Will get help right away if you are not doing well or get worse.   This information is not intended to replace advice given to you by your health care provider. Make sure you discuss any questions you have with your health care provider.   Document Released: 05/01/2003 Document Revised: 11/15/2014 Document Reviewed: 07/02/2013 Elsevier Interactive Patient Education [employer]

## 2015-12-21 NOTE — ED Notes (Signed)
Pt. Here via EMS from home due to clogged rt. picc line.

## 2015-12-21 NOTE — ED Notes (Addendum)
PICC line RN here to assess. Supplies given to RN per request.

## 2015-12-21 NOTE — ED Provider Notes (Signed)
[facility] Emergency Department Provider Note  _____________[facility]__  Time seen: Approximately 12:49 AM  I have reviewed the triage vital signs and the nursing notes.   HISTORY  Chief Complaint Vascular Access Problem  Caveat-history of present illness and review of systems is limited due to the patient's aphasia. All information is obtained from his daughter at bedside.  HPI Jeremiah Scott is a 75 y.o. 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 Home health nurse came to evaluate the patient yesterday and was unable to flush the PICC line. Patient has not had his antibiotics since he was discharged from [facility] 2 days ago. Daughter reports he has otherwise seemed to be recovering well, no fevers, no vomiting, no diarrhea, no apparent shortness of breath or complaint of chest pain.   Past Medical History  Diagnosis Date  . CVA (cerebral infarction)   . Hypertension   . Stroke [facility])     Patient Active Problem List   Diagnosis Date Noted  . Heel ulcer (HCC)   . Pressure ulcer 12/10/2015  . Wound infection (HCC) 12/09/2015    Past Surgical History  Procedure Laterality Date  . Irrigation and debridement foot Bilateral 12/12/2015    Procedure: IRRIGATION AND DEBRIDEMENT FOOT;  Surgeon: Gwyneth Revels, DPM;  Location: [facility] ORS;  Service: Podiatry;  Laterality: Bilateral;  . Application of wound vac Bilateral 12/12/2015    Procedure: APPLICATION OF WOUND VAC;  Surgeon: Gwyneth Revels, DPM;  Location: [facility] ORS;  Service: Podiatry;  Laterality: Bilateral;    Current Outpatient Rx  Name  Route  Sig  Dispense  Refill  . cefTRIAXone 2 g in dextrose 5 % 50 mL   Intravenous   Inject 2 g into the vein daily.  100 g   0   . clopidogrel (PLAVIX) 75 MG tablet   Oral   Take 1 tablet (75 mg total) by mouth daily.   30 tablet   0   . feeding supplement, ENSURE ENLIVE, (ENSURE ENLIVE) LIQD   Oral   Take 237 mLs by mouth 2 (two) times daily between meals.   237 mL   12   . metoprolol (LOPRESSOR) 50 MG tablet   Oral   Take 1 tablet (50 mg total) by mouth 2 (two) times daily.   60 tablet   0   . mupirocin ointment (BACTROBAN) 2 %   Topical   Apply 1 application topically 3 (three) times daily.           Allergies Review of patient's allergies indicates no known allergies.  History reviewed. No pertinent family history.  Social History Social History  Substance Use Topics  . Smoking status: Never Smoker   . Smokeless tobacco: None  . Alcohol Use: No    Review of Systems Constitutional: No fever/chills Gastrointestinal: No vomiting.  No diarrhea.  No constipation. Skin: Negative for rash.  Caveat-history of present illness and review of systems is limited due to the patient's aphasia. All information is obtained from his daughter at bedside. ____________________________________________   PHYSICAL EXAM:  VITAL SIGNS: ED Triage Vitals  Enc Vitals Group     BP 12/21/15 0045 169/85 mmHg     Pulse Rate 12/21/15 0045 92     Resp 12/21/15 0045 18  Temp 12/21/15 0045 98.6 F (37 C)     Temp Source 12/21/15 0045 Oral     SpO2 12/21/15 0045 100 %     Weight 12/21/15 0045 147 lb (66.679 kg)     Height --      Head Cir --      Peak Flow --      Pain Score --      Pain Loc --      Pain Edu? --      Excl. in GC? --     Constitutional: Alert, nontoxic appearing and in no acute distress. Eyes: Conjunctivae are normal. PERRL. EOMI. Head: Atraumatic. Nose: No congestion/rhinnorhea. Mouth/Throat: Mucous membranes are moist.  Oropharynx non-erythematous. Neck: No stridor.  Appears supple without meningismus. Cardiovascular: Normal rate, regular rhythm. Grossly normal  heart sounds.  Good peripheral circulation. Respiratory: Normal respiratory effort.  No retractions. Lungs CTAB. Gastrointestinal: Soft and nontender. No distention. No CVA tenderness. Genitourinary: deferred Musculoskeletal: No lower extremity tenderness nor edema.  No joint effusions. Neurologic:  + aphasia, is all extremities spontaneously. Skin:  Skin is warm, dry. 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 PICC line in the right arm. Psychiatric: Unable to assess  ____________________________________________   LABS (all labs ordered are listed, but only abnormal results are displayed)  Labs Reviewed - No data to display ____________________________________________  EKG  none ____________________________________________  RADIOLOGY  none ____________________________________________   PROCEDURES  Procedure(s) performed: None  Critical Care performed: No  ____________________________________________   INITIAL IMPRESSION / ASSESSMENT AND PLAN / ED COURSE  Pertinent labs & imaging results that were available during my care of the patient were reviewed by me and considered in my medical decision making (see chart for details).  Jeremiah Scott is a 75 y.o. 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 On exam, he is nontoxic appearing and in no acute distress. Vital signs are stable, he is afebrile. We are unable to flush his PICC line. Peripheral IV inserted. We'll give 2 g of ceftriaxone and consult the PICC team in the morning as he will require IV antibiotics for 6 weeks according to his recent discharge summary.  ----------------------------------------- 7:23 AM on 12/21/2015 -----------------------------------------  The patient is resting comfortably in the emergency department. The case was  discussed with the PICC team however they will not be able to see him until 10:30 AM. If they are unable to hit his PICC line to function, they will need to remove the existing PICC line in the right arm and replace it. Care transferred to Dr. Sharma Covert at this time. ____________________________________________   FINAL CLINICAL IMPRESSION(S) / ED DIAGNOSES  Final diagnoses:  Occluded PICC line, initial encounter (HCC)      Gayla Doss, MD 12/21/15 [phone]

## 2015-12-21 NOTE — ED Notes (Signed)
Transported home via EMS.

## 2015-12-21 NOTE — ED Notes (Signed)
Successful PICC line access by PICC line RN. Pending d/c. Will call family for transport if possible.

## 2015-12-21 NOTE — ED Notes (Signed)
Pt bed linen and diaper changed. EMS here for transport. Daughter at bedside. IV removed. EMS to transport home. D/c instructions given, discussed, and understood. Denied needs upon d/c.

## 2015-12-21 NOTE — ED Notes (Signed)
Report received from Matt RN  

## 2015-12-21 NOTE — ED Notes (Signed)
Called daughter and she is on way to help arrange transport home and discuss discharge instructions.

## 2015-12-21 NOTE — ED Notes (Signed)
Pt. Does not like

## 2015-12-23 ENCOUNTER — Other Ambulatory Visit: Payer: Self-pay | Admitting: Vascular Surgery

## 2015-12-24 MED ORDER — METHYLPREDNISOLONE SODIUM SUCC 125 MG IJ SOLR: 125.0000 mg | INTRAMUSCULAR | Status: DC | PRN

## 2015-12-24 MED ORDER — CEFUROXIME SODIUM 1.5 G IJ SOLR
1.5000 g | INTRAMUSCULAR | Status: AC
  Administered 2015-12-29: 1.5 g via INTRAVENOUS

## 2015-12-24 MED ORDER — FAMOTIDINE 20 MG PO TABS: 40.0000 mg | ORAL_TABLET | ORAL | Status: DC | PRN

## 2015-12-24 MED ORDER — HYDROMORPHONE HCL 1 MG/ML IJ SOLN: 1.0000 mg | Freq: Once | INTRAMUSCULAR | Status: DC

## 2015-12-24 MED ORDER — SODIUM CHLORIDE 0.9 % IV SOLN
INTRAVENOUS | Status: DC
  Administered 2015-12-29: 13:00:00 via INTRAVENOUS

## 2015-12-24 MED ORDER — ONDANSETRON HCL 4 MG/2ML IJ SOLN: 4.0000 mg | Freq: Four times a day (QID) | INTRAMUSCULAR | Status: DC | PRN

## 2015-12-29 ENCOUNTER — Ambulatory Visit: Payer: Self-pay | Admitting: Anesthesiology

## 2015-12-29 ENCOUNTER — Encounter: Payer: Self-pay | Admitting: *Deleted

## 2015-12-29 ENCOUNTER — Encounter
Admission: RE | Disposition: A | Discharge status: Home / Self Care | Payer: Self-pay | Source: Ambulatory Visit | Attending: Vascular Surgery

## 2015-12-29 ENCOUNTER — Ambulatory Visit
Admission: RE | Admit: 2015-12-29 | Discharge: 2015-12-29 | Disposition: A | Discharge status: Home / Self Care | Payer: Self-pay | Source: Ambulatory Visit | Attending: Vascular Surgery | Admitting: Vascular Surgery

## 2015-12-29 DIAGNOSIS — Z8673 Personal history of transient ischemic attack (TIA), and cerebral infarction without residual deficits: Secondary | ICD-10-CM | POA: Insufficient documentation

## 2015-12-29 DIAGNOSIS — I70234 Atherosclerosis of native arteries of right leg with ulceration of heel and midfoot: Secondary | ICD-10-CM | POA: Insufficient documentation

## 2015-12-29 DIAGNOSIS — Z79899 Other long term (current) drug therapy: Secondary | ICD-10-CM | POA: Insufficient documentation

## 2015-12-29 DIAGNOSIS — L97419 Non-pressure chronic ulcer of right heel and midfoot with unspecified severity: Secondary | ICD-10-CM | POA: Insufficient documentation

## 2015-12-29 DIAGNOSIS — I10 Essential (primary) hypertension: Secondary | ICD-10-CM | POA: Insufficient documentation

## 2015-12-29 DIAGNOSIS — L97429 Non-pressure chronic ulcer of left heel and midfoot with unspecified severity: Secondary | ICD-10-CM | POA: Insufficient documentation

## 2015-12-29 DIAGNOSIS — I70244 Atherosclerosis of native arteries of left leg with ulceration of heel and midfoot: Secondary | ICD-10-CM | POA: Insufficient documentation

## 2015-12-29 HISTORY — PX: PERIPHERAL VASCULAR CATHETERIZATION: SHX172C

## 2015-12-29 SURGERY — LOWER EXTREMITY ANGIOGRAPHY
Anesthesia: General | Laterality: Left

## 2015-12-29 MED ORDER — FENTANYL CITRATE (PF) 100 MCG/2ML IJ SOLN
INTRAMUSCULAR | Status: DC | PRN
  Administered 2015-12-29: 25 ug via INTRAVENOUS
  Administered 2015-12-29: 50 ug via INTRAVENOUS
  Administered 2015-12-29: 25 ug via INTRAVENOUS

## 2015-12-29 MED ORDER — ESMOLOL HCL 100 MG/10ML IV SOLN
INTRAVENOUS | Status: DC | PRN
  Administered 2015-12-29: 20 mg via INTRAVENOUS

## 2015-12-29 MED ORDER — LIDOCAINE-EPINEPHRINE (PF) 1 %-1:200000 IJ SOLN
INTRAMUSCULAR | Status: AC
  Filled 2015-12-29: qty 30

## 2015-12-29 MED ORDER — HEPARIN SODIUM (PORCINE) 1000 UNIT/ML IJ SOLN
INTRAMUSCULAR | Status: DC | PRN
  Administered 2015-12-29: 4 mL via INTRAVENOUS

## 2015-12-29 MED ORDER — METOPROLOL TARTRATE 1 MG/ML IV SOLN
INTRAVENOUS | Status: DC | PRN
  Administered 2015-12-29: 2 mg via INTRAVENOUS

## 2015-12-29 MED ORDER — HYDRALAZINE HCL 20 MG/ML IJ SOLN
10.0000 mg | Freq: Once | INTRAMUSCULAR | Status: AC
  Administered 2015-12-29: 10 mg via INTRAVENOUS

## 2015-12-29 MED ORDER — IOHEXOL 300 MG/ML  SOLN
INTRAMUSCULAR | Status: DC | PRN
  Administered 2015-12-29: 110 mL via INTRA_ARTERIAL

## 2015-12-29 MED ORDER — PROPOFOL 10 MG/ML IV BOLUS
INTRAVENOUS | Status: DC | PRN
Start: 2015-12-29 — End: 2015-12-29
  Administered 2015-12-29: 100 mg via INTRAVENOUS

## 2015-12-29 MED ORDER — MIDAZOLAM HCL 2 MG/2ML IJ SOLN
INTRAMUSCULAR | Status: DC | PRN
  Administered 2015-12-29 (×2): 1 mg via INTRAVENOUS

## 2015-12-29 MED ORDER — HEPARIN (PORCINE) IN NACL 2-0.9 UNIT/ML-% IJ SOLN
INTRAMUSCULAR | Status: AC
  Filled 2015-12-29: qty 1000

## 2015-12-29 SURGICAL SUPPLY — 22 items
BALLN LUTONIX 5X150X130 (BALLOONS) ×3
BALLN LUTONIX DCB 4X100X130 (BALLOONS) ×3
BALLN ULTRVRSE 2.5X300X150 (BALLOONS) ×3
BALLN ULTRVRSE 3X150X150 (BALLOONS) ×3
BALLOON LUTONIX 5X150X130 (BALLOONS) ×2 IMPLANT
BALLOON LUTONIX DCB 4X100X130 (BALLOONS) ×2 IMPLANT
BALLOON ULTRVRSE 2.5X300X150 (BALLOONS) ×2 IMPLANT
BALLOON ULTRVRSE 3X150X150 (BALLOONS) ×2 IMPLANT
CATH CXI SUPP ANG 4FR 135 (MICROCATHETER) ×2 IMPLANT
CATH CXI SUPP ANG 4FR 135CM (MICROCATHETER) ×3
CATH PIG 70CM (CATHETERS) ×3 IMPLANT
CATH VERT 100CM (CATHETERS) ×3 IMPLANT
DEVICE PRESTO INFLATION (MISCELLANEOUS) ×3 IMPLANT
DEVICE STARCLOSE SE CLOSURE (Vascular Products) ×3 IMPLANT
GLIDEWIRE ADV .035X260CM (WIRE) ×3 IMPLANT
PACK ANGIOGRAPHY (CUSTOM PROCEDURE TRAY) ×3 IMPLANT
SHEATH ANL2 6FRX45 HC (SHEATH) ×3 IMPLANT
SHEATH BRITE TIP 5FRX11 (SHEATH) ×3 IMPLANT
SYR MEDRAD MARK V 150ML (SYRINGE) ×3 IMPLANT
TUBING CONTRAST HIGH PRESS 72 (TUBING) ×3 IMPLANT
WIRE G V18X300CM (WIRE) ×6 IMPLANT
WIRE J 3MM .035X145CM (WIRE) ×3 IMPLANT

## 2015-12-29 NOTE — Progress Notes (Signed)
Right groin dsg dry and intact  No swelling  Redness or hardness

## 2015-12-29 NOTE — Anesthesia Postprocedure Evaluation (Signed)
Anesthesia Post Note  Patient: Jeremiah Scott  Procedure(s) Performed: Procedure(s) (LRB): Lower Extremity Angiography (Left) Lower Extremity Intervention  Patient location during evaluation: PACU Anesthesia Type: General Level of consciousness: awake and alert Pain management: pain level controlled Vital Signs Assessment: post-procedure vital signs reviewed and stable Respiratory status: spontaneous breathing, nonlabored ventilation, respiratory function stable and patient connected to nasal cannula oxygen Cardiovascular status: blood pressure returned to baseline and stable Postop Assessment: no signs of nausea or vomiting Anesthetic complications: no   Patient combative during blood pressure readings Last Vitals:  Filed Vitals:   12/29/15 1555 12/29/15 1623  BP: 200/101   Pulse: 95 66  Temp:    Resp: 22 13                  Shataria Crist K Blossie Raffel

## 2015-12-29 NOTE — Anesthesia Preprocedure Evaluation (Addendum)
Anesthesia Evaluation  Patient identified by MRN, date of birth, ID band Patient awake    Reviewed: Allergy & Precautions, H&P , NPO status , Patient's Chart, lab work & pertinent test results, reviewed documented beta blocker date and time   History of Anesthesia Complications (+) DIFFICULT AIRWAY and history of anesthetic complications  Airway Mallampati: III  TM Distance: >3 FB Neck ROM: limited    Dental  (+) Chipped   Pulmonary neg pulmonary ROS, neg shortness of breath,           Cardiovascular Exercise Tolerance: Poor hypertension, Pt. on medications (-) angina+ DOE  (-) Past MI      Neuro/Psych PSYCHIATRIC DISORDERS CVA, Residual Symptoms    GI/Hepatic negative GI ROS, Neg liver ROS, neg GERD  ,  Endo/Other  negative endocrine ROS  Renal/GU Renal InsufficiencyRenal disease Bladder dysfunction      Musculoskeletal   Abdominal   Peds  Hematology negative hematology ROS (+)   Anesthesia Other Findings Past Medical History:   CVA (cerebral infarction)                                    Hypertension                                                 Stroke [facility])                                                Past Surgical History:   IRRIGATION AND DEBRIDEMENT FOOT                 Bilateral 12/12/2015       Comment:Procedure: IRRIGATION AND DEBRIDEMENT FOOT;                Surgeon: Gwyneth Revels, DPM;  Location: [facility]               ORS;  Service: Podiatry;  Laterality:               Bilateral;   APPLICATION OF WOUND VAC                        Bilateral 12/12/2015       Comment:Procedure: APPLICATION OF WOUND VAC;  Surgeon:               Gwyneth Revels, DPM;  Location: [facility] ORS;                Service: Podiatry;  Laterality: Bilateral;     Reproductive/Obstetrics                          Anesthesia Physical  Anesthesia Plan  ASA: IV  Anesthesia Plan: General   Post-op Pain  Management:    Induction: Intravenous  Airway Management Planned:   Additional Equipment:   Intra-op Plan:   Post-operative Plan:   Informed Consent: I have reviewed the patients History and Physical, chart, labs and discussed the procedure including the risks, benefits and alternatives for the proposed anesthesia with the patient or authorized representative who has indicated his/her understanding and  acceptance.     Plan Discussed with: CRNA, Anesthesiologist and Surgeon  Anesthesia Plan Comments: (Wife and daughter consented.  High risk of post op cognitive problems explained to them, they voiced understanding.)      Anesthesia Quick Evaluation

## 2015-12-29 NOTE — Op Note (Signed)
[facility] Percutaneous Study/Intervention Procedural Note   Date of Surgery: 12/29/2015  Surgeon(s):DEW,JASON   Assistants:none  Pre-operative Diagnosis: PAD with ulceration BLE   Post-operative diagnosis: Same  Procedure(s) Performed: 1. Ultrasound guidance for vascular access right femoral artery 2. Catheter placement into left posterior tibial artery and left anterior tibial artery from right femoral approach 3. Aortogram and selective bilateral lower extremity angiograms 4. Percutaneous transluminal angioplasty of left distal SFA and above-knee popliteal artery with 5 mm diameter Lutonix drug-coated angioplasty balloon and percutaneous transluminal angioplasty of below-knee popliteal artery with 4 mm diameter Lutonix drug-coated angioplasty balloon 5. Percutaneous transluminal angioplasty of tibioperoneal trunk and proximal posterior tibial artery with 3 mm diameter angioplasty balloon  6.  Percutaneous transluminal angioplasty of left anterior tibial artery from the origin to the dorsalis pedis artery in the foot with 2.5 mm diameter by 30 cm length angioplasty balloon 7. StarClose closure device right femoral artery  EBL: 25 cc  Contrast: 110 cc  Fluro Time: 17 minutes  Anesthesia: general  Indications: Patient is a 75 y.o.male with severe infected heel ulcerations bilaterally. He has nonpalpable pedal pulses. He has significant contractures in both lower extremities and requires general anesthesia to perform the procedure and for this reason we will also evaluate the right lower extremity to make sure that we need to come back to treat the right side. The patient is brought in for angiography for further evaluation and potential treatment. Risks and benefits are discussed and informed consent is obtained  Procedure: The patient was identified and appropriate  procedural time out was performed. The patient was then placed supine on the table and prepped and draped in the usual sterile fashion.Moderate conscious sedation was administered during a face to face encounter with the patient throughout the procedure with my supervision of the RN administering medicines and monitoring the patient's vital signs, pulse oximetry, telemetry and mental status throughout from the start of the procedure until the patient was taken to the recovery room. Ultrasound was used to evaluate the right common femoral artery. It was patent . A digital ultrasound image was acquired. A Seldinger needle was used to access the right common femoral artery under direct ultrasound guidance and a permanent image was performed. A 0.035 J wire was advanced without resistance and a 5Fr sheath was placed. Pigtail catheter was placed into the aorta and an AP aortogram was performed. 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 I then crossed the aortic bifurcation and advanced to the left femoral head. Selective left lower extremity angiogram was then performed. This demonstrated multiple tandem stenoses in the area of the above-knee popliteal artery at Hunter's canal with a high-grade stenosis in the below-knee popliteal artery. The anterior tibial artery then had a long occlusion of the reconstituted just above the ankle. The posterior tibial artery and tibioperoneal trunk and moderate to high-grade stenosis in the 70-80% range proximally and terminated at the ankle. The peroneal artery was small but continuous after the tibioperoneal trunk stenosis. The patient was systemically heparinized and a 6 [place] [employer] sheath was then placed over the [employer] wire. I then used a Kumpe catheter and the advantage wire to navigate down into the distal SFA and popliteal artery. Across the distal  popliteal stenosis and got into the tibioperoneal trunk and exchanged for a 0.018 wire after confirming intraluminal flow in the posterior tibial artery. I then  treated the tibioperoneal trunk and proximal posterior tibial artery stenosis with a 3 mm diameter by 15 cm length angioplasty balloon. This was inflated to 10 atm for 1 minute. Completion angiogram following this showed significant improvement and less than 30% residual stenosis. I then turned my attention to the distal SFA and popliteal lesions. The below-knee popliteal artery was treated with a 4 mm diameter by 10 cm length Lutonix drug-coated angioplasty balloon inflated to 8 atm for 1 minute. The above-knee popliteal artery and distal SFA were treated with a 5 mm diameter by 15 cm length Lutonix drug-coated angioplasty balloon inflated to 10 atm 1. The SFA and popliteal lesions demonstrated less than 25% residual stenosis after these angioplasty so no stent was placed. I then turned my attention to the anterior tibial artery. This was difficult to cannulate and I had to exchange for a CXI catheter after the Kumpe catheter was unsuccessful. I initially started with a 0.035 advantage wire was exchanged for a 0.018V 18 wire was able to get into the anterior tibial artery and then cross the long occlusion confirming intraluminal flow with the CXI catheter and the dorsalis pedis artery in the foot. The wire was then replaced. A 2.5 mm diameter by 30 cm length angioplasty balloon was inflated from the ankle up through the origin of the anterior tibial artery encompassing its entirety. Inflated to 14 atm to break the waist and resolve the stenosis. Completion angiogram following this showed several areas of mild stenosis but nowhere greater than 50% throughout the antrum tibial artery and preserved flow through the posterior tibial artery until its termination at the ankle and in the peroneal artery which also terminated at the ankle. I felt we had done as  much as possible to improve his left lower extremity perfusion. I pulled the sheath back to the ipsilateral external iliac artery and image the right lower extremity due to the difficulty in bringing him back for another procedure I wanted to be sure that there was significant disease worth a second procedure. The second procedure would be from a left femoral approach. This demonstrated near occlusive stenosis in the popliteal artery at just below the knee with what appeared to be diseased one-vessel runoff distally. He will need to come back for a second procedure on the right leg, but we had finished his left leg revascularization today. I elected to terminate the procedure. The sheath was removed and StarClose closure device was deployed in the left femoral artery with excellent hemostatic result. The patient was taken to the recovery room in stable condition having tolerated the procedure well.  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 The anterior tibial artery then had a long occlusion of the reconstituted just above the ankle. The posterior tibial artery and tibioperoneal trunk and moderate to high-grade stenosis in the 70-80% range proximally and terminated at the ankle. The peroneal artery was small but continuous after the tibioperoneal trunk stenosis.   Disposition: Patient was taken to the recovery room in stable condition having tolerated the procedure well.  Complications: None  DEW,JASON 12/29/2015 3:20 PM

## 2015-12-29 NOTE — Anesthesia Procedure Notes (Signed)
Procedure Name: LMA Insertion Date/Time: 12/29/2015 1:54 PM Performed by: Omer Jack Pre-anesthesia Checklist: Patient identified, Patient being monitored, Timeout performed, Emergency Drugs available and Suction available Patient Re-evaluated:Patient Re-evaluated prior to inductionOxygen Delivery Method: Circle system utilized Preoxygenation: Pre-oxygenation with 100% oxygen Intubation Type: IV induction Ventilation: Mask ventilation without difficulty LMA: LMA inserted LMA Size: 3.5 Tube type: Oral Number of attempts: 1 Placement Confirmation: positive ETCO2 and breath sounds checked- equal and bilateral Tube secured with: Tape Dental Injury: Teeth and Oropharynx as per pre-operative assessment

## 2015-12-29 NOTE — Progress Notes (Signed)
Pt fighting staff hard to obtain blood pressure

## 2015-12-29 NOTE — Transfer of Care (Signed)
Immediate Anesthesia Transfer of Care Note  Patient: Jeremiah Scott  Procedure(s) Performed: Procedure(s): Lower Extremity Angiography (Left) Lower Extremity Intervention  Patient Location: PACU  Anesthesia Type:General  Level of Consciousness: awake, alert  and confused  Airway & Oxygen Therapy: Patient Spontanous Breathing and Patient connected to face mask oxygen  Post-op Assessment: Report given to RN and Post -op Vital signs reviewed and stable  Post vital signs: Reviewed and stable  Last Vitals:  Filed Vitals:   12/29/15 1300 12/29/15 1534  BP: 182/86 188/92  Pulse: 94   Temp:  36.3 C  Resp: 14     Complications: No apparent anesthesia complications

## 2015-12-29 NOTE — Progress Notes (Signed)
Manual blood pressure 180/98  Dr Henrene Hawking in room to see pt   Received hydralazine  earlier  No bleeding to site no hardness   Pt very hard to manage   Heart rate 114

## 2015-12-29 NOTE — H&P (Signed)
  [facility] History & Physical Update  The patient was interviewed and re-examined.  The patient's previous History and Physical has been reviewed and is unchanged.  There is no change in the plan of care. We plan to proceed with the scheduled procedure.  DEW,JASON, MD  12/29/2015, 1:08 PM

## 2015-12-30 ENCOUNTER — Encounter: Payer: Self-pay | Admitting: Vascular Surgery

## 2016-10-22 ENCOUNTER — Encounter (INDEPENDENT_AMBULATORY_CARE_PROVIDER_SITE_OTHER): Payer: Self-pay

## 2016-10-22 ENCOUNTER — Ambulatory Visit (INDEPENDENT_AMBULATORY_CARE_PROVIDER_SITE_OTHER): Payer: Self-pay | Admitting: Vascular Surgery

## 2017-05-22 IMAGING — CR DG FOOT 2V*R*
2 series · 2 of 2 positions shown · non-contrast
Comparison: None.

CLINICAL DATA: Bilateral heel ulcers.

EXAM:
LEFT FOOT - 2 VIEW; RIGHT FOOT - 2 VIEW

[foot ap]
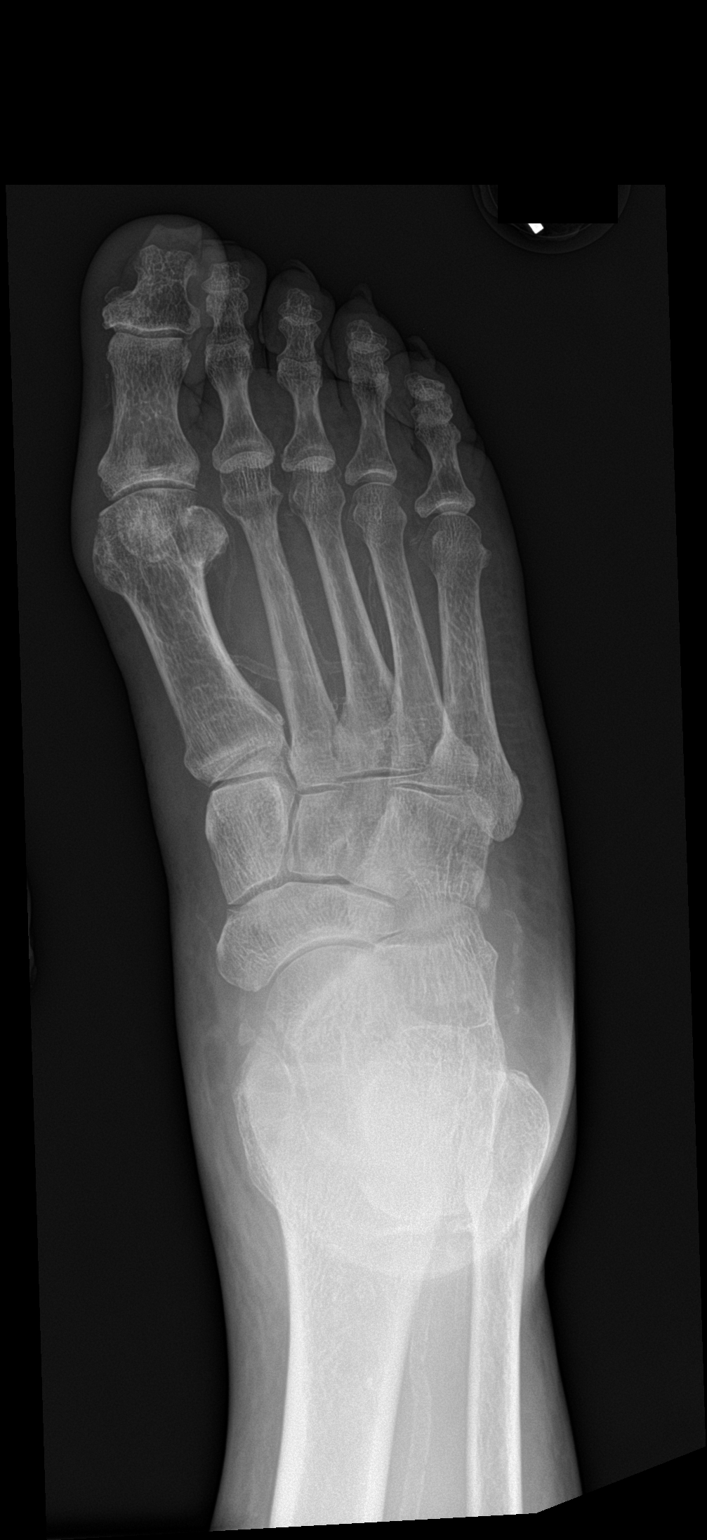

[foot lat]
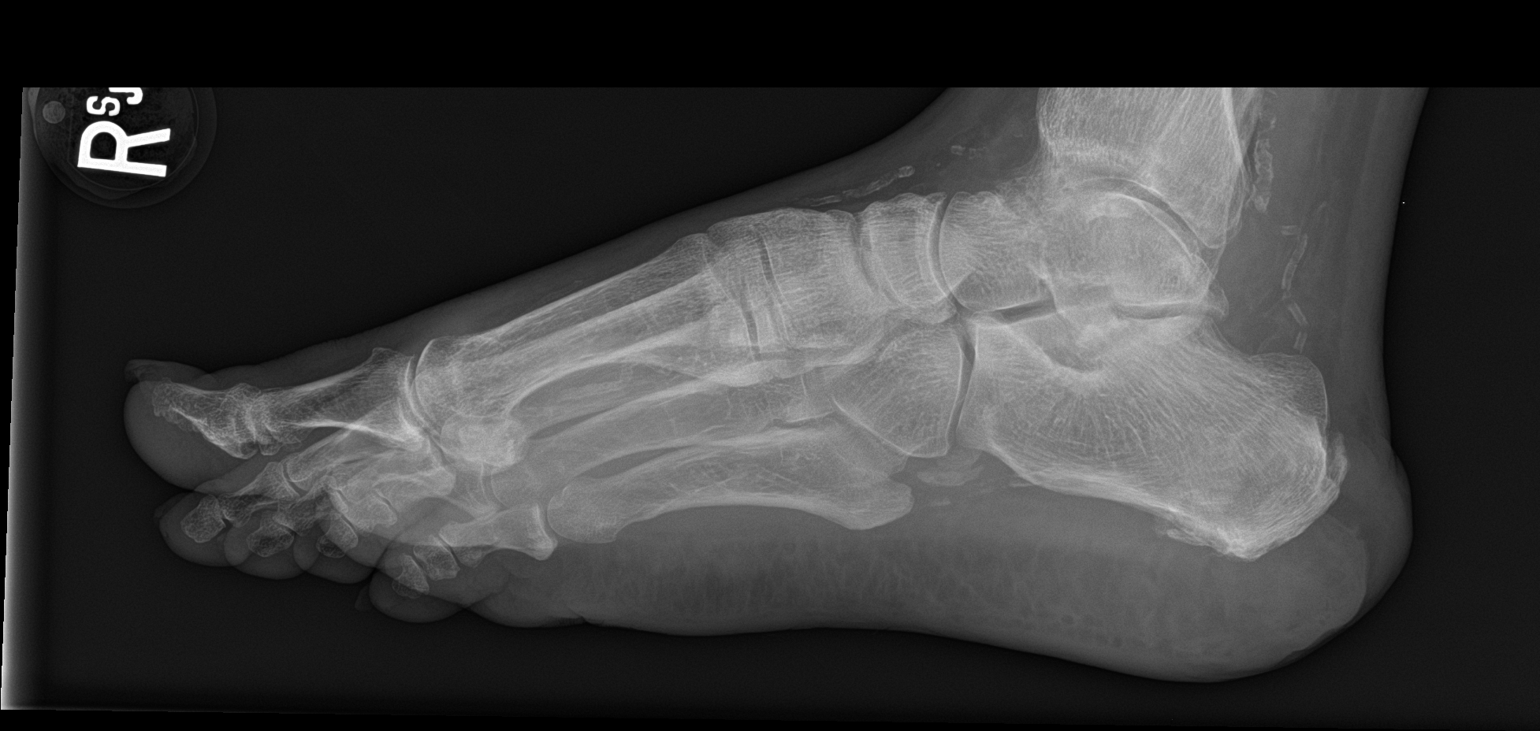

[2 of 2 positions shown; findings below may reference images not displayed]

FINDINGS: Left foot:

Mild to moderate degenerative changes most notably at the first
metatarsal phalangeal joint. Extensive vascular calcifications are
noted. There is a large open wound involving the heel. No obvious
destructive bony changes involving the calcaneus to suggest
osteomyelitis. Calcaneal spurring changes are noted.

Right foot:

Mild to moderate degenerative changes most notably at the first
metatarsal phalangeal joint. Extensive vascular calcifications. Soft
tissue defect involving the heel but no obvious destructive bony
changes to suggest osteomyelitis.
IMPRESSION: Bilateral heel ulcers without obvious plain film findings for
underlying osteomyelitis. MRI may be helpful for further evaluation.

## 2017-05-28 IMAGING — CR DG CHEST 1V PORT
1 series · 1 of 1 positions shown · non-contrast
Comparison: None.

CLINICAL DATA: PICC line placement

EXAM:
PORTABLE CHEST 1 VIEW

[ap]
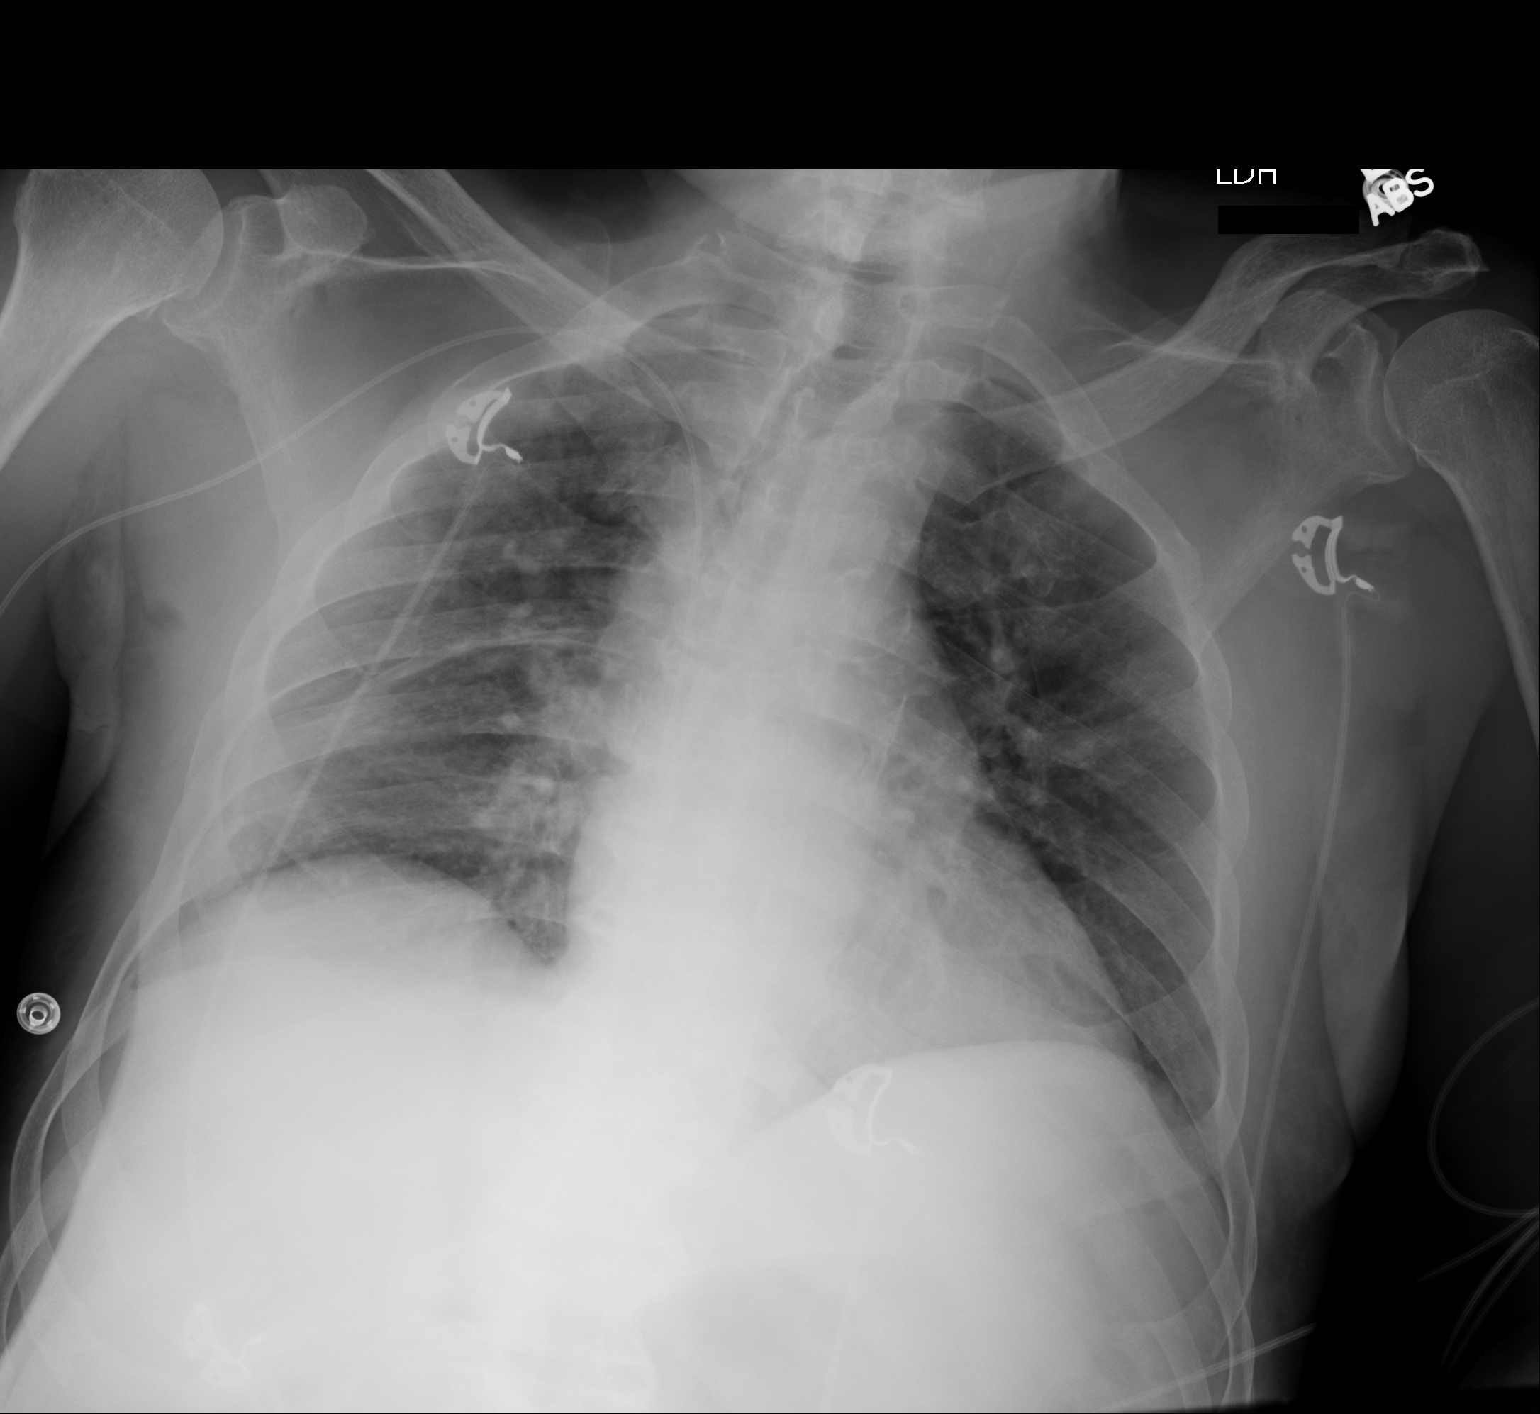

[1 of 1 positions shown; findings below may reference images not displayed]

FINDINGS: Right PICC line is in place with the tip at the cavoatrial junction.
There is mild vascular congestion. Low lung volumes without
confluent opacity or effusion. No acute bony abnormality. Heart is
borderline in size.
IMPRESSION: Right PICC line tip at the cavoatrial junction.

Mild vascular congestion
